# Patient Record
Sex: Female | Born: 1975 | Hispanic: No | Marital: Married | Smoking: Never smoker
Health system: Southern US, Community
[De-identification: ages and names within clinical notes are randomized; demographics above are authoritative.]

## PROBLEM LIST (undated history)

## (undated) HISTORY — PX: MULTIPLE TOOTH EXTRACTIONS: SHX2053

---

## 2006-02-04 ENCOUNTER — Inpatient Hospital Stay (HOSPITAL_COMMUNITY): Admission: AD | Admit: 2006-02-04 | Discharge: 2006-02-04 | Payer: Self-pay | Admitting: Obstetrics & Gynecology

## 2006-04-15 ENCOUNTER — Inpatient Hospital Stay (HOSPITAL_COMMUNITY): Admission: RE | Admit: 2006-04-15 | Discharge: 2006-04-18 | Payer: Self-pay | Admitting: Obstetrics & Gynecology

## 2007-02-03 ENCOUNTER — Inpatient Hospital Stay (HOSPITAL_COMMUNITY): Admission: RE | Admit: 2007-02-03 | Discharge: 2007-02-03 | Payer: Self-pay | Admitting: Obstetrics

## 2007-04-11 ENCOUNTER — Inpatient Hospital Stay (HOSPITAL_COMMUNITY): Admission: AD | Admit: 2007-04-11 | Discharge: 2007-04-14 | Payer: Self-pay | Admitting: Obstetrics

## 2008-05-25 ENCOUNTER — Inpatient Hospital Stay (HOSPITAL_COMMUNITY): Admission: AD | Admit: 2008-05-25 | Discharge: 2008-05-25 | Payer: Self-pay | Admitting: Obstetrics & Gynecology

## 2008-08-03 ENCOUNTER — Inpatient Hospital Stay (HOSPITAL_COMMUNITY): Admission: AD | Admit: 2008-08-03 | Discharge: 2008-08-05 | Payer: Self-pay | Admitting: Obstetrics & Gynecology

## 2010-04-26 LAB — CBC
HCT: 38.4 % (ref 36.0–46.0)
Hemoglobin: 13.2 g/dL (ref 12.0–15.0)
Hemoglobin: 13.7 g/dL (ref 12.0–15.0)
MCHC: 35.5 g/dL (ref 30.0–36.0)
MCV: 98 fL (ref 78.0–100.0)
RBC: 3.92 MIL/uL (ref 3.87–5.11)
RBC: 4.02 MIL/uL (ref 3.87–5.11)
WBC: 13.6 10*3/uL — ABNORMAL HIGH (ref 4.0–10.5)
WBC: 8.7 10*3/uL (ref 4.0–10.5)

## 2010-04-26 LAB — RH IMMUNE GLOB WKUP(>/=20WKS)(NOT WOMEN'S HOSP)

## 2010-04-28 LAB — RH IMMUNE GLOBULIN WORKUP (NOT WOMEN'S HOSP)
ABO/RH(D): O NEG
Antibody Screen: NEGATIVE

## 2010-06-02 NOTE — H&P (Signed)
Loretta Adams, Loretta Adams NO.:  [id number]   MEDICAL RECORD NO.:  [id number]          PATIENT TYPE:  INP   LOCATION:  9126                          FACILITY:  [facility]   PHYSICIAN:  Roseanna Rainbow, M.D.DATE OF BIRTH:  11-06-1975   DATE OF ADMISSION:  08/03/2008  DATE OF DISCHARGE:                              HISTORY & PHYSICAL   CHIEF COMPLAINT:  The patient is a 35 year old para 2 with an estimated  date of confinement of  July 17, with an intrauterine pregnancy at 40  weeks for an elective induction labor.   HISTORY OF PRESENT ILLNESS:  Please see the above.   ALLERGIES:  No known drug allergies.   MEDICATIONS:  None.   OBSTETRICAL RISK FACTORS:  History of LGA infant, Rh negative  nonsensitized.  She received RhoGAM at 28 weeks.   PRENATAL LABS:  Blood type O negative.  Antibody screen negative.  Chlamydia probe negative.  Urine culture and sensitivity no growth.  Pap  smear negative.  GC probe negative.  One hour GCT 133.  Hepatitis B  surface antigen negative.  Hematocrit 38.0, hemoglobin 12.5, HIV  nonreactive, platelet count 227,000.  RPR nonreactive.  Rubella immune.  Sickle cell negative.  GBS negative on June 21.   PAST OBSTETRICAL HISTORY:  In March 2008, she was delivered of a live  born female, full term vacuum assisted delivery, no complications and in  March 2009, she was delivered of a live born female, full term, birth  weight 8 pounds, vaginal delivery, no complications.   PAST GYNECOLOGICAL HISTORY:  Noncontributory.   PAST MEDICAL HISTORY:  No significant history of medical diseases.   PAST SURGICAL HISTORY:  No previous surgery.   SOCIAL HISTORY:  She is unemployed.  Married.  Living with her spouse.  Does not give any significant history of alcohol usage and she has no  significant smoking history.  Denies illicit drug use.   FAMILY HISTORY:  Adult-onset diabetes.   REVIEW OF SYSTEMS:  GENITOURINARY:  Mild contractions.   PHYSICAL EXAMINATION:  VITAL SIGNS:  Stable, afebrile.  Fetal heart  tracing reassuring.  Tocodynamometer irregular uterine contractions.  LUNGS:  Per the RN.  HEART:  Per the RN.  GENITOURINARY:  Cervical exam per the RN.  The cervix is 1-2 cm dilated  and 60% effaced.   ASSESSMENT:  Multipara at term for an elective induction of labor.  Fetal heart tracing consistent with fetal well being.   PLAN:  Admission, low dose Pitocin per protocol.      Roseanna Rainbow, M.D.  Electronically Signed     LAJ/MEDQ  D:  08/03/2008  T:  08/03/2008  Job:  [id number]

## 2010-06-05 NOTE — Op Note (Signed)
Loretta Adams, MCCUBBINS NO.:  [id number]   MEDICAL RECORD NO.:  [id number]          PATIENT TYPE:  INP   LOCATION:  9164                          [facility]ILITY:  [facility]   PHYSICIAN:  Roseanna Rainbow, M.D.DATE OF BIRTH:  11-Jun-1975   DATE OF PROCEDURE:  04/16/2006  DATE OF DISCHARGE:                               OPERATIVE REPORT   DELIVERY NOTE:  The patient was fully dilated and pushing, direct OA.  The station was +3 to +4. There was maternal exhaustion secondary to a  prolonged second stage. The decision was made to proceed with a vacuum-  assisted delivered. The perineum was infiltrated with lidocaine and a  midline episiotomy was cut. The mushroom vacuum was then applied to the  vertex. After the first attempt, there were two pop offs. At this point,  the [facility] was applied, and there was crowning and delivery of the head.  The shoulders were then delivered without difficulty. The cord was  clamped and cut. The infant was taken over to the warmer. The placenta  was then removed with cord traction. There were bilateral sulcal tears  as well as extension of the episiotomy into the external anal sphincter  capsule. These lacerations were repaired in the usual fashion with 2-0  and 3-0 Vicryl Rapide. The estimated blood loss was less than 500 mL.  The mother and female infant were in stable condition.      Roseanna Rainbow, M.D.  Electronically Signed     LAJ/MEDQ  D:  04/16/2006  T:  04/16/2006  Job:  [id number]

## 2010-10-08 LAB — RH IMMUNE GLOBULIN WORKUP (NOT WOMEN'S HOSP): Antibody Screen: NEGATIVE

## 2010-10-12 LAB — RH IMMUNE GLOB WKUP(>/=20WKS)(NOT WOMEN'S HOSP): Fetal Screen: NEGATIVE

## 2010-10-12 LAB — CBC
Hemoglobin: 12
MCV: 92.6
Platelets: 211
RBC: 3.77 — ABNORMAL LOW
RDW: 12.6
RDW: 12.9
WBC: 15.7 — ABNORMAL HIGH

## 2010-10-12 LAB — RPR: RPR Ser Ql: NONREACTIVE

## 2011-07-23 ENCOUNTER — Ambulatory Visit: Payer: PRIVATE HEALTH INSURANCE

## 2011-07-23 ENCOUNTER — Ambulatory Visit (INDEPENDENT_AMBULATORY_CARE_PROVIDER_SITE_OTHER): Payer: PRIVATE HEALTH INSURANCE | Admitting: Family Medicine

## 2011-07-23 VITALS — BP 100/62 | HR 59 | Temp 97.8°F | Resp 22 | Ht 67.0 in | Wt 189.0 lb

## 2011-07-23 DIAGNOSIS — R0602 Shortness of breath: Secondary | ICD-10-CM

## 2011-07-23 DIAGNOSIS — Z77098 Contact with and (suspected) exposure to other hazardous, chiefly nonmedicinal, chemicals: Secondary | ICD-10-CM

## 2011-07-23 DIAGNOSIS — Z7729 Contact with and (suspected ) exposure to other hazardous substances: Secondary | ICD-10-CM

## 2011-07-23 DIAGNOSIS — J309 Allergic rhinitis, unspecified: Secondary | ICD-10-CM

## 2011-07-23 MED ORDER — METHYLPREDNISOLONE ACETATE 80 MG/ML IJ SUSP: 80.0000 mg | Freq: Once | INTRAMUSCULAR | Status: DC

## 2011-07-23 MED ORDER — METHYLPREDNISOLONE SODIUM SUCC 125 MG IJ SOLR
125.0000 mg | Freq: Once | INTRAMUSCULAR | Status: AC
  Administered 2011-07-23: 125 mg via INTRAMUSCULAR

## 2011-07-23 MED ORDER — METHYLPREDNISOLONE SODIUM SUCC 125 MG IJ SOLR: 125.0000 mg | Freq: Once | INTRAMUSCULAR | Status: DC

## 2011-07-23 MED ORDER — ALBUTEROL SULFATE (2.5 MG/3ML) 0.083% IN NEBU
2.5000 mg | INHALATION_SOLUTION | Freq: Once | RESPIRATORY_TRACT | Status: AC
  Administered 2011-07-23: 2.5 mg via RESPIRATORY_TRACT

## 2011-07-23 MED ORDER — ALBUTEROL SULFATE HFA 108 (90 BASE) MCG/ACT IN AERS: 2.0000 | INHALATION_SPRAY | Freq: Four times a day (QID) | RESPIRATORY_TRACT | Status: DC | PRN

## 2011-07-23 NOTE — Progress Notes (Signed)
  [facility]:  Office Visit  Chief Complaint:  Chief Complaint  Patient presents with  . Shortness of Breath    inhaled cleaner 4 days ago--feels tight    HPI: Loretta Adams is a 36 y.o. female who complains of: Allergic reaction to bleach 4 days ago and tire cleaner. Was not wearing mask. She has no h/o ashtma or allergies. She denies any URI sxs. This has happened before. Nonsmoker.  Has had prior problems with smoke fumes as well but not this bad. Has not tried anything for this. She is on progesterone but no other risk factors for DVT/PE.   History reviewed. No pertinent past medical history. History reviewed. No pertinent past surgical history. History   Social History  . Marital Status: Married    Spouse Name: N/A    Number of Children: N/A  . Years of Education: N/A   Social History Main Topics  . Smoking status: Never Smoker   . Smokeless tobacco: None  . Alcohol Use: No  . Drug Use: No  . Sexually Active: None   Other Topics Concern  . None   Social History Narrative  . None   Family History  Problem Relation Age of Onset  . Asthma Maternal Grandmother    Allergies  Allergen Reactions  . Amoxicillin Palpitations   Prior to Admission medications   Medication Sig Start Date End Date Taking? Authorizing Provider  levonorgestrel (MIRENA) 20 MCG/24HR IUD 1 each by Intrauterine route once.   Yes Historical Provider, MD     ROS: The patient denies fevers, chills, night sweats, unintentional weight loss, chest pain, palpitations, wheezing, dyspnea on exertion, nausea, vomiting, abdominal pain, dysuria, hematuria, melena, numbness, weakness, or tingling. + SOB  All other systems have been reviewed and were otherwise negative with the exception of those mentioned in the HPI and as above.    PHYSICAL EXAM: Filed Vitals:   07/23/11 1729  BP: 100/62  Pulse: 59  Temp: 97.8 F (36.6 C)  Resp: 22   Filed Vitals:   07/23/11 1729  Height:  5\' 7"  (1.702 m)  Weight: 189 lb (85.73 kg)   Body mass index is 29.60 kg/(m^2).  General: Alert, no acute distress HEENT:  Normocephalic, atraumatic, oropharynx patent.  Cardiovascular:  Regular rate and rhythm, no rubs murmurs or gallops.  No Carotid bruits, radial pulse intact. No pedal edema.  Respiratory: Clear to auscultation bilaterally.  No wheezes, rales, or rhonchi.  No cyanosis, no use of accessory musculature GI: No organomegaly, abdomen is soft and non-tender, positive bowel sounds.  No masses. Skin: No rashes. Neurologic: Facial musculature symmetric. Psychiatric: Patient is appropriate throughout our interaction. Lymphatic: No cervical lymphadenopathy Musculoskeletal: Gait intact.   LABS:  EKG/XRAY:   Primary read interpreted by Dr. Conley Rolls at [facility]. No infiltrate/effusion.   ASSESSMENT/PLAN: Encounter Diagnoses  Name Primary?  . SOB (shortness of breath) Yes  . Exposure to chemical irritant   . Allergic reaction to inhalant     Rx: IM Solumedrol x 1 and also albuterol inhaler prn for home. If worsening sxs consider medrol/PO steroid.    Parnika Tweten PHUONG, DO 07/24/2011 9:15 AM

## 2012-04-26 ENCOUNTER — Ambulatory Visit: Payer: PRIVATE HEALTH INSURANCE | Admitting: Obstetrics & Gynecology

## 2013-01-01 ENCOUNTER — Ambulatory Visit: Payer: PRIVATE HEALTH INSURANCE | Admitting: Obstetrics & Gynecology

## 2013-01-01 ENCOUNTER — Encounter: Payer: Self-pay | Admitting: Obstetrics & Gynecology

## 2013-01-01 ENCOUNTER — Ambulatory Visit (INDEPENDENT_AMBULATORY_CARE_PROVIDER_SITE_OTHER): Payer: BC Managed Care – PPO | Admitting: Obstetrics & Gynecology

## 2013-01-01 VITALS — BP 122/78 | HR 61 | Temp 98.1°F | Ht 67.0 in | Wt 199.0 lb

## 2013-01-01 DIAGNOSIS — Z0000 Encounter for general adult medical examination without abnormal findings: Secondary | ICD-10-CM

## 2013-01-01 DIAGNOSIS — Z3202 Encounter for pregnancy test, result negative: Secondary | ICD-10-CM

## 2013-01-01 DIAGNOSIS — Z01419 Encounter for gynecological examination (general) (routine) without abnormal findings: Secondary | ICD-10-CM

## 2013-01-01 LAB — POCT URINALYSIS DIPSTICK
Bilirubin, UA: NEGATIVE
Glucose, UA: NEGATIVE
Nitrite, UA: NEGATIVE
Spec Grav, UA: >=1.03
pH, UA: 5

## 2013-01-01 NOTE — Progress Notes (Signed)
Subjective:     Loretta Adams is a 37 y.o. female here for a routine exam.  Current complaints: irregular vaginal bleeding, states she would like schedule to have IUD taken out, has occasional when talking would like to know if it could be because of hormones she is taking  Personal health questionnaire reviewed: yes.   Gynecologic History No LMP recorded. Patient is not currently having periods (Reason: IUD). Contraception: IUD Last ZOX:[id number]. Results were: denies any abnormal results Last mammogram: N/A  Obstetric History OB History  No data available     The following portions of the patient's history were reviewed and updated as appropriate: allergies, current medications, past family history, past medical history, past social history, past surgical history and problem list.  Review of Systems Pertinent items are noted in HPI.    Objective:    General appearance: alert Breasts: normal appearance, no masses or tenderness Abdomen: soft, non-tender; bowel sounds normal; no masses,  no organomegaly Pelvic: cervix normal in appearance, IUD strings palpated, external genitalia normal, no adnexal masses or tenderness, uterus normal size, shape, and consistency and vagina normal without discharge    Assessment:    Healthy female exam.    Plan:   Return prn or 9/15

## 2013-01-02 ENCOUNTER — Encounter: Payer: Self-pay | Admitting: Obstetrics & Gynecology

## 2013-01-02 NOTE — Patient Instructions (Signed)

## 2013-01-03 LAB — PAP IG AND HPV HIGH-RISK: HPV DNA High Risk: NOT DETECTED

## 2013-02-28 ENCOUNTER — Ambulatory Visit: Payer: BC Managed Care – PPO | Admitting: Obstetrics & Gynecology

## 2013-09-26 ENCOUNTER — Ambulatory Visit: Payer: BC Managed Care – PPO | Admitting: Obstetrics & Gynecology

## 2013-09-27 ENCOUNTER — Encounter: Payer: Self-pay | Admitting: Obstetrics & Gynecology

## 2013-09-27 ENCOUNTER — Ambulatory Visit (INDEPENDENT_AMBULATORY_CARE_PROVIDER_SITE_OTHER): Payer: BC Managed Care – PPO | Admitting: Obstetrics & Gynecology

## 2013-09-27 VITALS — BP 137/81 | HR 67 | Temp 97.8°F | Wt 211.0 lb

## 2013-09-27 DIAGNOSIS — Z30432 Encounter for removal of intrauterine contraceptive device: Secondary | ICD-10-CM

## 2013-09-27 DIAGNOSIS — Z01818 Encounter for other preprocedural examination: Secondary | ICD-10-CM

## 2013-09-27 DIAGNOSIS — Z3202 Encounter for pregnancy test, result negative: Secondary | ICD-10-CM

## 2013-09-27 LAB — POCT URINE PREGNANCY: Preg Test, Ur: NEGATIVE

## 2013-09-30 NOTE — Progress Notes (Signed)
Patient ID: Loretta Adams, female   DOB: 11-08-1975, 38 y.o.   MRN: [id number]  Chief Complaint  Patient presents with  . Contraception    iud removal    HPI Loretta Adams is a 38 y.o. female.  See above.  Considering becoming pregnant.  HPI  No past medical history on file.  Past Surgical History  Procedure Laterality Date  . Multiple tooth extractions      Family History  Problem Relation Age of Onset  . Asthma Maternal Grandmother     Social History History  Substance Use Topics  . Smoking status: Never Smoker   . Smokeless tobacco: Not on file  . Alcohol Use: No    Allergies  Allergen Reactions  . Amoxicillin Palpitations    Current Outpatient Prescriptions  Medication Sig Dispense Refill  . albuterol (PROVENTIL HFA;VENTOLIN HFA) 108 (90 BASE) MCG/ACT inhaler Inhale 2 puffs into the lungs every 6 (six) hours as needed for wheezing or shortness of breath.  1 Inhaler  0  . levonorgestrel (MIRENA) 20 MCG/24HR IUD 1 each by Intrauterine route once.       No current facility-administered medications for this visit.    Review of Systems Review of Systems Constitutional: negative for fatigue and weight loss Respiratory: negative for cough and wheezing Cardiovascular: negative for chest pain, fatigue and palpitations Gastrointestinal: negative for abdominal pain and change in bowel habits Genitourinary:negative for abnormal vaginal discharge Integument/breast: negative for nipple discharge Musculoskeletal:negative for myalgias Neurological: negative for gait problems and tremors Behavioral/Psych: negative for abusive relationship, depression Endocrine: negative for temperature intolerance     Blood pressure 137/81, pulse 67, temperature 97.8 F (36.6 C), weight 95.709 kg (211 lb).  Physical Exam Physical Exam General:   alert  Skin:   no rash or abnormalities  Lungs:   clear to auscultation bilaterally  Heart:   regular rate and rhythm, S1, S2 normal, no  murmur, click, rub or gallop  Abdomen:  normal findings: no organomegaly, soft, non-tender and no hernia  Pelvis:  External genitalia: normal general appearance Urinary system: urethral meatus normal and bladder without fullness, nontender Vaginal: normal without tenderness, induration or masses Cervix: normal appearance; IUD strings seen; the IUD was removed intact Adnexa: normal bimanual exam Uterus: anteverted and non-tender, normal size      Data Reviewed None  Assessment    S/P IUD removal     Plan   Implications of AMA and pregnancy reviewed Preconception pt education materials provided Orders Placed This Encounter  Procedures  . POCT urine pregnancy    Follow up as needed.         JACKSON-MOORE,Lamark Schue A 09/30/2013, 11:21 AM

## 2013-09-30 NOTE — Patient Instructions (Signed)
Preparing for Pregnancy Before trying to become pregnant, make an appointment with your health care provider (preconception care). The goal is to help you have a healthy, safe pregnancy. At your first appointment, your health care provider will:   Do a complete physical exam, including a Pap test.  Take a complete medical history.  Give you advice and help you resolve any problems. PRECONCEPTION CHECKLIST Here is a list of the basics to cover with your health care provider at your preconception visit:  Medical history.  Tell your health care provider about any diseases you have had. Many diseases can affect your pregnancy.  Include your partner's medical history and family history.  Make sure you have been tested for sexually transmitted infections (STIs). These can affect your pregnancy. In some cases, they can be passed to your baby. Tell your health care provider about any history of STIs.  Make sure your health care provider knows about any previous problems you have had with conception or pregnancy.  Tell your health care provider about any medicine you take. This includes herbal supplements and over-the-counter medicines.  Make sure all your immunizations are up to date. You may need to make additional appointments.  Ask your health care provider if you need any vaccinations or if there are any you should avoid.  Diet.  It is especially important to eat a healthy, balanced diet with the right nutrients when you are pregnant.  Ask your health care provider to help you get to a healthy weight before pregnancy.  If you are overweight, you are at higher risk for certain complications. These include high blood pressure, diabetes, and preterm birth.  If you are underweight, you are more likely to have a low-birth-weight baby.  Lifestyle.  Tell your health care provider about lifestyle factors such as alcohol use, drug use, or smoking.  Describe any harmful substances you may  be exposed to at work or home. These can include chemicals, pesticides, and radiation.  Mental health.  Let your health care provider know if you have been feeling depressed or anxious.  Let your health care provider know if you have a history of substance abuse.  Let your health care provider know if you do not feel safe at home. HOME INSTRUCTIONS TO PREPARE FOR PREGNANCY Follow your health care provider's advice and instructions.   Keep an accurate record of your menstrual periods. This makes it easier for your health care provider to determine your baby's due date.  Begin taking prenatal vitamins and folic acid supplements daily. Take them as directed by your health care provider.  Eat a balanced diet. Get help from a nutrition counselor if you have questions or need help.  Get regular exercise. Try to be active for at least 30 minutes a day most days of the week.  Quit smoking, if you smoke.  Do not drink alcohol.  Do not take illegal drugs.  Get medical problems, such as diabetes or high blood pressure, under control.  If you have diabetes, make sure you do the following:  Have good blood sugar control. If you have type 1 diabetes, use multiple daily doses of insulin. Do not use split-dose or premixed insulin.  Have an eye exam by a qualified eye care professional trained in caring for people with diabetes.  Get evaluated by your health care provider for cardiovascular disease.  Get to a healthy weight. If you are overweight or obese, reduce your weight with the help of a qualified health   professional such as a registered dietitian. Ask your health care provider what the right weight range is for you. HOW DO I KNOW I AM PREGNANT? You may be pregnant if you have been sexually active and you miss your period. Symptoms of early pregnancy include:   Mild cramping.  Very light vaginal bleeding (spotting).  Feeling unusually tired.  Morning sickness. If you have any of  these symptoms, take a home pregnancy test. These tests look for a hormone called human chorionic gonadotropin (hCG) in your urine. Your body begins to make this hormone during early pregnancy. These tests are very accurate. Wait until at least the first day you miss your period to take one. If you get a positive result, call your health care provider to make appointments for prenatal care. WHAT SHOULD I DO IF I BECOME PREGNANT?  Make an appointment with your health care provider by week 12 of your pregnancy at the latest.  Do not smoke. Smoking can be harmful to your baby.  Do not drink alcoholic beverages. Alcohol is related to a number of birth defects.  Avoid toxic odors and chemicals.  You may continue to have sexual intercourse if it does not cause pain or other problems, such as vaginal bleeding. Document Released: 12/18/2007 Document Revised: 05/21/2013 Document Reviewed: 12/11/2012 ExitCare Patient Information 2015 [employer]. This information is not intended to replace advice given to you by your health care provider. Make sure you discuss any questions you have with your health care provider.  

## 2014-01-14 ENCOUNTER — Encounter: Payer: Self-pay | Admitting: *Deleted

## 2014-01-15 ENCOUNTER — Encounter: Payer: Self-pay | Admitting: Obstetrics & Gynecology

## 2014-01-18 NOTE — L&D Delivery Note (Addendum)
Delivery Note At 6:06 PM a viable female, "Yousef", was delivered via  (Presentation: Left Occiput Anterior).  APGAR: , ; weight  .   Placenta status: Intact, Spontaneous.  Cord: 3 vessels with the following complications: None.  Cord pH: NA  Anesthesia: Epidural  Episiotomy: None Lacerations: 2nd degree;Perineal Suture Repair: 3.0 vicryl Est. Blood Loss (mL):  100 cc  Mom to postpartum.  Baby to Couplet care / Skin to Skin. Family plans outpatient circumcision. Patient is undecided regarding contraception.  Nigel BridgemanLATHAM, Nixxon Faria 07/19/2014, 6:33 PM

## 2014-03-14 LAB — OB RESULTS CONSOLE RPR: RPR: NONREACTIVE

## 2014-03-14 LAB — OB RESULTS CONSOLE ABO/RH: RH TYPE: NEGATIVE

## 2014-03-14 LAB — OB RESULTS CONSOLE RUBELLA ANTIBODY, IGM: RUBELLA: IMMUNE

## 2014-03-14 LAB — OB RESULTS CONSOLE ANTIBODY SCREEN: Antibody Screen: NEGATIVE

## 2014-03-14 LAB — OB RESULTS CONSOLE HEPATITIS B SURFACE ANTIGEN: Hepatitis B Surface Ag: NEGATIVE

## 2014-03-14 LAB — OB RESULTS CONSOLE HIV ANTIBODY (ROUTINE TESTING): HIV: NONREACTIVE

## 2014-03-14 LAB — OB RESULTS CONSOLE GC/CHLAMYDIA
Chlamydia: NEGATIVE
Gonorrhea: NEGATIVE

## 2014-07-15 ENCOUNTER — Encounter (HOSPITAL_COMMUNITY): Payer: Self-pay | Admitting: *Deleted

## 2014-07-15 ENCOUNTER — Telehealth (HOSPITAL_COMMUNITY): Payer: Self-pay | Admitting: *Deleted

## 2014-07-15 ENCOUNTER — Inpatient Hospital Stay (HOSPITAL_COMMUNITY): Admission: RE | Admit: 2014-07-15 | Payer: Self-pay | Source: Ambulatory Visit

## 2014-07-15 LAB — OB RESULTS CONSOLE GBS: GBS: NEGATIVE

## 2014-07-15 NOTE — Telephone Encounter (Signed)
Preadmission screen  

## 2014-07-16 ENCOUNTER — Encounter (HOSPITAL_COMMUNITY): Payer: Self-pay | Admitting: *Deleted

## 2014-07-16 ENCOUNTER — Telehealth (HOSPITAL_COMMUNITY): Payer: Self-pay | Admitting: *Deleted

## 2014-07-16 NOTE — Telephone Encounter (Signed)
Preadmission screen  

## 2014-07-19 ENCOUNTER — Inpatient Hospital Stay (HOSPITAL_COMMUNITY)
Admission: RE | Admit: 2014-07-19 | Discharge: 2014-07-20 | DRG: 775 | Disposition: A | Discharge status: Home / Self Care | Payer: BLUE CROSS/BLUE SHIELD | Source: Ambulatory Visit | Attending: Obstetrics & Gynecology | Admitting: Obstetrics & Gynecology

## 2014-07-19 ENCOUNTER — Inpatient Hospital Stay (HOSPITAL_COMMUNITY): Payer: BLUE CROSS/BLUE SHIELD | Admitting: Anesthesiology

## 2014-07-19 ENCOUNTER — Encounter (HOSPITAL_COMMUNITY): Payer: Self-pay

## 2014-07-19 DIAGNOSIS — O09299 Supervision of pregnancy with other poor reproductive or obstetric history, unspecified trimester: Secondary | ICD-10-CM

## 2014-07-19 DIAGNOSIS — O3663X0 Maternal care for excessive fetal growth, third trimester, not applicable or unspecified: Principal | ICD-10-CM | POA: Diagnosis present

## 2014-07-19 DIAGNOSIS — O09523 Supervision of elderly multigravida, third trimester: Secondary | ICD-10-CM | POA: Diagnosis present

## 2014-07-19 DIAGNOSIS — Z3A39 39 weeks gestation of pregnancy: Secondary | ICD-10-CM | POA: Diagnosis present

## 2014-07-19 DIAGNOSIS — O0930 Supervision of pregnancy with insufficient antenatal care, unspecified trimester: Secondary | ICD-10-CM

## 2014-07-19 DIAGNOSIS — Z880 Allergy status to penicillin: Secondary | ICD-10-CM

## 2014-07-19 DIAGNOSIS — IMO0002 Reserved for concepts with insufficient information to code with codable children: Secondary | ICD-10-CM | POA: Diagnosis present

## 2014-07-19 DIAGNOSIS — O280 Abnormal hematological finding on antenatal screening of mother: Secondary | ICD-10-CM | POA: Diagnosis present

## 2014-07-19 DIAGNOSIS — O26899 Other specified pregnancy related conditions, unspecified trimester: Secondary | ICD-10-CM | POA: Diagnosis present

## 2014-07-19 DIAGNOSIS — Z6791 Unspecified blood type, Rh negative: Secondary | ICD-10-CM | POA: Diagnosis present

## 2014-07-19 DIAGNOSIS — O09529 Supervision of elderly multigravida, unspecified trimester: Secondary | ICD-10-CM

## 2014-07-19 LAB — CBC
HCT: 34.9 % — ABNORMAL LOW (ref 36.0–46.0)
Hemoglobin: 11.5 g/dL — ABNORMAL LOW (ref 12.0–15.0)
MCH: 30 pg (ref 26.0–34.0)
MCHC: 33 g/dL (ref 30.0–36.0)
MCV: 91.1 fL (ref 78.0–100.0)
Platelets: 171 10*3/uL (ref 150–400)
RBC: 3.83 MIL/uL — AB (ref 3.87–5.11)
RDW: 14.2 % (ref 11.5–15.5)
WBC: 9.3 10*3/uL (ref 4.0–10.5)

## 2014-07-19 LAB — TYPE AND SCREEN
ABO/RH(D): O NEG
ANTIBODY SCREEN: NEGATIVE

## 2014-07-19 MED ORDER — ZOLPIDEM TARTRATE 5 MG PO TABS: 5.0000 mg | ORAL_TABLET | Freq: Every evening | ORAL | Status: DC | PRN

## 2014-07-19 MED ORDER — OXYTOCIN 40 UNITS IN LACTATED RINGERS INFUSION - SIMPLE MED: 62.5000 mL/h | INTRAVENOUS | Status: DC

## 2014-07-19 MED ORDER — OXYCODONE-ACETAMINOPHEN 5-325 MG PO TABS: 2.0000 | ORAL_TABLET | ORAL | Status: DC | PRN

## 2014-07-19 MED ORDER — IBUPROFEN 600 MG PO TABS
600.0000 mg | ORAL_TABLET | Freq: Four times a day (QID) | ORAL | Status: DC
  Administered 2014-07-20 (×3): 600 mg via ORAL
  Filled 2014-07-19 (×4): qty 1

## 2014-07-19 MED ORDER — LIDOCAINE HCL (PF) 1 % IJ SOLN
INTRAMUSCULAR | Status: DC | PRN
  Administered 2014-07-19 (×2): 4 mL

## 2014-07-19 MED ORDER — LIDOCAINE HCL (PF) 1 % IJ SOLN
30.0000 mL | INTRAMUSCULAR | Status: DC | PRN
  Administered 2014-07-19: 30 mL via SUBCUTANEOUS
  Filled 2014-07-19: qty 30

## 2014-07-19 MED ORDER — PHENYLEPHRINE 40 MCG/ML (10ML) SYRINGE FOR IV PUSH (FOR BLOOD PRESSURE SUPPORT)
80.0000 ug | PREFILLED_SYRINGE | INTRAVENOUS | Status: DC | PRN
  Administered 2014-07-19: 1 ug via INTRAVENOUS
  Filled 2014-07-19: qty 2
  Filled 2014-07-19: qty 20

## 2014-07-19 MED ORDER — DIPHENHYDRAMINE HCL 50 MG/ML IJ SOLN: 12.5000 mg | INTRAMUSCULAR | Status: DC | PRN

## 2014-07-19 MED ORDER — TERBUTALINE SULFATE 1 MG/ML IJ SOLN
0.2500 mg | Freq: Once | INTRAMUSCULAR | Status: DC | PRN
  Filled 2014-07-19: qty 1

## 2014-07-19 MED ORDER — DIPHENHYDRAMINE HCL 25 MG PO CAPS: 25.0000 mg | ORAL_CAPSULE | Freq: Four times a day (QID) | ORAL | Status: DC | PRN

## 2014-07-19 MED ORDER — OXYCODONE-ACETAMINOPHEN 5-325 MG PO TABS: 1.0000 | ORAL_TABLET | ORAL | Status: DC | PRN

## 2014-07-19 MED ORDER — FLEET ENEMA 7-19 GM/118ML RE ENEM: 1.0000 | ENEMA | RECTAL | Status: DC | PRN

## 2014-07-19 MED ORDER — ONDANSETRON HCL 4 MG PO TABS: 4.0000 mg | ORAL_TABLET | ORAL | Status: DC | PRN

## 2014-07-19 MED ORDER — ONDANSETRON HCL 4 MG/2ML IJ SOLN: 4.0000 mg | Freq: Four times a day (QID) | INTRAMUSCULAR | Status: DC | PRN

## 2014-07-19 MED ORDER — TETANUS-DIPHTH-ACELL PERTUSSIS 5-2.5-18.5 LF-MCG/0.5 IM SUSP: 0.5000 mL | Freq: Once | INTRAMUSCULAR | Status: DC

## 2014-07-19 MED ORDER — SIMETHICONE 80 MG PO CHEW: 80.0000 mg | CHEWABLE_TABLET | ORAL | Status: DC | PRN

## 2014-07-19 MED ORDER — PRENATAL MULTIVITAMIN CH
1.0000 | ORAL_TABLET | Freq: Every day | ORAL | Status: DC
  Administered 2014-07-20: 1 via ORAL
  Filled 2014-07-19: qty 1

## 2014-07-19 MED ORDER — WITCH HAZEL-GLYCERIN EX PADS: 1.0000 "application " | MEDICATED_PAD | CUTANEOUS | Status: DC | PRN

## 2014-07-19 MED ORDER — OXYTOCIN BOLUS FROM INFUSION: 500.0000 mL | INTRAVENOUS | Status: DC

## 2014-07-19 MED ORDER — LACTATED RINGERS IV SOLN: 500.0000 mL | INTRAVENOUS | Status: DC | PRN

## 2014-07-19 MED ORDER — BENZOCAINE-MENTHOL 20-0.5 % EX AERO
1.0000 "application " | INHALATION_SPRAY | CUTANEOUS | Status: DC | PRN
  Administered 2014-07-20: 1 via TOPICAL
  Filled 2014-07-19 (×2): qty 56

## 2014-07-19 MED ORDER — ACETAMINOPHEN 325 MG PO TABS: 650.0000 mg | ORAL_TABLET | ORAL | Status: DC | PRN

## 2014-07-19 MED ORDER — FENTANYL 2.5 MCG/ML BUPIVACAINE 1/10 % EPIDURAL INFUSION (WH - ANES)
14.0000 mL/h | INTRAMUSCULAR | Status: DC | PRN
  Administered 2014-07-19 (×2): 14 mL/h via EPIDURAL
  Filled 2014-07-19: qty 125

## 2014-07-19 MED ORDER — EPHEDRINE 5 MG/ML INJ
10.0000 mg | INTRAVENOUS | Status: DC | PRN
  Filled 2014-07-19: qty 2

## 2014-07-19 MED ORDER — FENTANYL CITRATE (PF) 100 MCG/2ML IJ SOLN: 100.0000 ug | INTRAMUSCULAR | Status: DC | PRN

## 2014-07-19 MED ORDER — LANOLIN HYDROUS EX OINT: TOPICAL_OINTMENT | CUTANEOUS | Status: DC | PRN

## 2014-07-19 MED ORDER — OXYTOCIN 40 UNITS IN LACTATED RINGERS INFUSION - SIMPLE MED
1.0000 m[IU]/min | INTRAVENOUS | Status: DC
  Administered 2014-07-19: 2 m[IU]/min via INTRAVENOUS
  Administered 2014-07-19: 4 m[IU]/min via INTRAVENOUS
  Administered 2014-07-19: 18 m[IU]/min via INTRAVENOUS
  Filled 2014-07-19: qty 1000

## 2014-07-19 MED ORDER — DIBUCAINE 1 % RE OINT: 1.0000 "application " | TOPICAL_OINTMENT | RECTAL | Status: DC | PRN

## 2014-07-19 MED ORDER — SENNOSIDES-DOCUSATE SODIUM 8.6-50 MG PO TABS
2.0000 | ORAL_TABLET | ORAL | Status: DC
  Administered 2014-07-20: 2 via ORAL
  Filled 2014-07-19: qty 2

## 2014-07-19 MED ORDER — CITRIC ACID-SODIUM CITRATE 334-500 MG/5ML PO SOLN: 30.0000 mL | ORAL | Status: DC | PRN

## 2014-07-19 MED ORDER — LACTATED RINGERS IV SOLN
INTRAVENOUS | Status: DC
  Administered 2014-07-19: 500 mL via INTRAVENOUS

## 2014-07-19 MED ORDER — ONDANSETRON HCL 4 MG/2ML IJ SOLN
4.0000 mg | INTRAMUSCULAR | Status: DC | PRN
Start: 2014-07-19 — End: 2014-07-20

## 2014-07-19 NOTE — Progress Notes (Signed)
  Subjective: Comfortable with epidural.  Objective: BP 113/70 mmHg  Pulse 68  Temp(Src) 97.8 F (36.6 C) (Oral)  Resp 18  Ht 5\' 5"  (1.651 m)  Wt 103.42 kg (228 lb)  BMI 37.94 kg/m2  SpO2 100%  LMP 10/15/2013      FHT: Category 1 UC:   regular, every 3 minutes SVE:   Dilation: 4 Effacement (%): 80 Station: -3 Exam by:: V Darryll Raju  Vtx not well applied at present. Pitocin at 16 mu/min  Assessment:  Induction for favorable cervix and hx LGA infant  Plan: Continue current plan. Recheck in 1 hour for possible AROM.  Nigel BridgemanLATHAM, Jenna Routzahn CNM 07/19/2014, 3:08 PM

## 2014-07-19 NOTE — H&P (Signed)
Loretta Adams is a 39 y.o. female, G4P3003 at 3939 4/7 weeks, presenting for induction due to hx of LGA fetus and favorable cervix.  Denies leaking or bleeding, reports +FM.  Cervix has been 3 cm in the office.  Patient Active Problem List   Diagnosis Date Noted  . LGA (large for gestational age) fetus 07/19/2014  . Marginal insertion of umbilical cord 07/19/2014  . Abnormal maternal serum screening test--elevated risk of Trisomy 18 on screening, declined further testing 07/19/2014  . AMA (advanced maternal age) multigravida 35+ 07/19/2014  . Rh negative status during pregnancy 07/19/2014  . Late prenatal care 07/19/2014  . Hx of delivery by vacuum extraction, currently pregnant--2008 delivery 07/19/2014  . Normal labor 07/19/2014  . Allergy to amoxicillin 07/19/2014    History of present pregnancy: Patient entered care at 23 4/7 weeks--previous patient of [facility], but no care prior to establishing with [facility]. EDC of 07/22/14 was established by LMP Anatomy scan:  At 26 weeks at [facility]--marginal insertion suspected.   Additional US evaluations:   30 3/7 weeks:  Marginal insertion of cord, EFW 3+14, 69%ile, AFI 10.8, low normal 32 3/7 weeks:  EFW 5+2, 88%ile, normal fluid. 36 4/7 weeks:  EFW 7+5, 85%ile, normal fluid   Significant prenatal events:  Entered care at 23 weeks.  Elevated trisomy 18 risk (1;46), but patient declined MFM referral and any additional testing.  Followed with US q 4 weeks due to marginal cord insertion and elevated trisomy 18 risk.  GBS negative.  Requested induction at 39 weeks due to hx LGA fetuses in past.  Received Rhophylac 05/16/14. Last evaluation:  07/09/14--cervix 3 cm, BP 108/60.  OB History    Gravida Para Term Preterm AB TAB SAB Ectopic Multiple Living   4 3 3       3     2008--VE assisted due to maternal exhaustion, 9+7, epidural, female, Dr. Tamela OddiJackson Moore 2009--SVB, 8+14, female, [facility] 2010--SVB, 8+14, epidural, [facility]  History reviewed. No pertinent past medical  history. Past Surgical History  Procedure Laterality Date  . Multiple tooth extractions     Family History: family history includes Asthma in her maternal grandmother; Diabetes in her maternal grandmother; Heart disease in her maternal grandmother; Hyperlipidemia in her maternal grandmother; Hypertension in her maternal grandmother.   Social History:  reports that she has never smoked. She has never used smokeless tobacco. She reports that she does not drink alcohol or use illicit drugs.  Patient is from [place], of the Muslim faith, married to FOB, college-educated, employed as an Data processing managerassistant teacher.  FOB is [employer].   Prenatal Transfer Tool  Maternal Diabetes: No Genetic Screening: Abnormal:  Results: Elevated risk of Trisomy 18 on Quad screen--declined further testing, no evidence of abnormalities on US Maternal Ultrasounds/Referrals: Normal Fetal Ultrasounds or other Referrals:  None Maternal Substance Abuse:  No Significant Maternal Medications:  None Significant Maternal Lab Results: Lab values include: Group B Strep negative, Rh negative  TDAP NA Flu NA  ROS:  FM, occasional UCs.  Allergies  Allergen Reactions  . Amoxicillin Palpitations     Dilation: 3 Effacement (%): 50 Station: -3 Exam by:: V Desire Fulp Blood pressure 126/69, pulse 76, last menstrual period 10/15/2013.  Chest clear Heart RRR without murmur Abd gravid, NT, FH 40 cm Pelvic: Cervix posterior, 3 cm, 50%, vtx, -3. Ext: WNL  FHR: Category 1 UCs:  q 5 min, mild  Prenatal labs: ABO, Rh: O/Negative/-- (02/25 0000) Antibody: Negative (02/25 0000) Rubella:   Immune RPR: Nonreactive (  02/25 0000)  HBsAg: Negative (02/25 0000)  HIV: Non-reactive (02/25 0000)  GBS: Negative (06/27 0000) Sickle cell/Hgb electrophoresis:  WNL Pap:  Unknown GC:  Negative 03/14/14 Chlamydia:  Negative 03/14/14 Genetic screenings:  Elevated Trisomy 18 risk (1:46) on Quad screen, declined further testing Glucola:  WNL Other:   Hgb 12.4 at NOB, 12.1 at 28 weeks       Assessment/Plan: IUP at 39 4/7 weeks Hx LGA fetus Favorable cervix GBS negative Elevated risk of Trisomy 18 on screening, declined further testing Marginal insertion of cord Rh negative  Plan: Admit to Birthing Suite per consult with Dr. Sallye Ober Routine [facility] orders Start pitocin per low-dose protocol Pain med/epidural prn   Loretta Adams, VICKICNM, [place] 07/19/2014, 8:14 AM

## 2014-07-19 NOTE — Anesthesia Preprocedure Evaluation (Signed)
Anesthesia Evaluation  Patient identified by MRN, date of birth, ID band Patient awake    Reviewed: Allergy & Precautions, NPO status , Patient's Chart, lab work & pertinent test results  History of Anesthesia Complications Negative for: history of anesthetic complications  Airway Mallampati: II  TM Distance: >3 FB Neck ROM: Full    Dental no notable dental hx. (+) Dental Advisory Given   Pulmonary neg pulmonary ROS,  breath sounds clear to auscultation  Pulmonary exam normal       Cardiovascular negative cardio ROS Normal cardiovascular examRhythm:Regular Rate:Normal     Neuro/Psych negative neurological ROS  negative psych ROS   GI/Hepatic negative GI ROS, Neg liver ROS,   Endo/Other  obesity  Renal/GU negative Renal ROS  negative genitourinary   Musculoskeletal negative musculoskeletal ROS (+)   Abdominal   Peds negative pediatric ROS (+)  Hematology negative hematology ROS (+)   Anesthesia Other Findings   Reproductive/Obstetrics (+) Pregnancy                             Anesthesia Physical Anesthesia Plan  ASA: II  Anesthesia Plan: Epidural   Post-op Pain Management:    Induction:   Airway Management Planned:   Additional Equipment:   Intra-op Plan:   Post-operative Plan:   Informed Consent: I have reviewed the patients History and Physical, chart, labs and discussed the procedure including the risks, benefits and alternatives for the proposed anesthesia with the patient or authorized representative who has indicated his/her understanding and acceptance.   Dental advisory given  Plan Discussed with:   Anesthesia Plan Comments:         Anesthesia Quick Evaluation

## 2014-07-19 NOTE — Progress Notes (Signed)
  Subjective: Comfortable with epidural.  Objective: BP 108/85 mmHg  Pulse 71  Temp(Src) 97.8 F (36.6 C) (Oral)  Resp 18  Ht 5\' 5"  (1.651 m)  Wt 103.42 kg (228 lb)  BMI 37.94 kg/m2  SpO2 100%  LMP 10/15/2013      FHT: Category 1 UC:   regular, every 3-4 minutes SVE:   Dilation: 4 Effacement (%): 80 Station: -3 Exam by:: V Mayce Noyes  Vtx too high for AROM at present Pitocin at 10 mu/min  Assessment:  Induction for LGA and favorable cervix GBS negative  Plan: Continue pitocin induction, plan AROM as vtx descends.  Nigel BridgemanLATHAM, Enis Leatherwood CNM 07/19/2014, 2:05 PM

## 2014-07-19 NOTE — Anesthesia Procedure Notes (Signed)
Epidural Patient location during procedure: OB  Staffing Anesthesiologist: Shanavia Makela Performed by: anesthesiologist   Preanesthetic Checklist Completed: patient identified, site marked, surgical consent, pre-op evaluation, timeout performed, IV checked, risks and benefits discussed and monitors and equipment checked  Epidural Patient position: sitting Prep: site prepped and draped and DuraPrep Patient monitoring: continuous pulse ox and blood pressure Approach: midline Location: L3-L4 Injection technique: LOR saline  Needle:  Needle type: Tuohy  Needle gauge: 17 G Needle length: 9 cm and 9 Needle insertion depth: 7 cm Catheter type: closed end flexible Catheter size: 19 Gauge Catheter at skin depth: 11 cm Test dose: negative  Assessment Events: blood not aspirated, injection not painful, no injection resistance, negative IV test and no paresthesia  Additional Notes Patient identified. Risks/Benefits/Options discussed with patient including but not limited to bleeding, infection, nerve damage, paralysis, failed block, incomplete pain control, headache, blood pressure changes, nausea, vomiting, reactions to medication both or allergic, itching and postpartum back pain. Confirmed with bedside nurse the patient's most recent platelet count. Confirmed with patient that they are not currently taking any anticoagulation, have any bleeding history or any family history of bleeding disorders. Patient expressed understanding and wished to proceed. All questions were answered. Sterile technique was used throughout the entire procedure. Please see nursing notes for vital signs. Test dose was given through epidural catheter and negative prior to continuing to dose epidural or start infusion. Warning signs of high block given to the patient including shortness of breath, tingling/numbness in hands, complete motor block, or any concerning symptoms with instructions to call for help. Patient was  given instructions on fall risk and not to get out of bed. All questions and concerns addressed with instructions to call with any issues or inadequate analgesia.      

## 2014-07-20 LAB — CBC
HCT: 35.1 % — ABNORMAL LOW (ref 36.0–46.0)
Hemoglobin: 11.4 g/dL — ABNORMAL LOW (ref 12.0–15.0)
MCH: 30 pg (ref 26.0–34.0)
MCHC: 32.5 g/dL (ref 30.0–36.0)
MCV: 92.4 fL (ref 78.0–100.0)
Platelets: 179 10*3/uL (ref 150–400)
RBC: 3.8 MIL/uL — AB (ref 3.87–5.11)
RDW: 14.3 % (ref 11.5–15.5)
WBC: 11.1 10*3/uL — AB (ref 4.0–10.5)

## 2014-07-20 LAB — HIV ANTIBODY (ROUTINE TESTING W REFLEX): HIV SCREEN 4TH GENERATION: NONREACTIVE

## 2014-07-20 LAB — RPR: RPR: NONREACTIVE

## 2014-07-20 MED ORDER — RHO D IMMUNE GLOBULIN 1500 UNIT/2ML IJ SOSY
300.0000 ug | PREFILLED_SYRINGE | Freq: Once | INTRAMUSCULAR | Status: AC
  Administered 2014-07-20: 300 ug via INTRAVENOUS
  Filled 2014-07-20: qty 2

## 2014-07-20 MED ORDER — IBUPROFEN 600 MG PO TABS: 600.0000 mg | ORAL_TABLET | Freq: Four times a day (QID) | ORAL | Status: DC | PRN

## 2014-07-20 NOTE — Discharge Instructions (Signed)

## 2014-07-20 NOTE — Lactation Note (Signed)
This note was copied from the chart of Loretta Adams. Lactation Consultation Note: experienced BF mom reports she does not have any milk yet and has been giving formula also. Encouraged to always breast feed first then give small amounts of formula if baby is still hungry. Reports baby is latching well with just a little pain at the beginning of the feeding. Baby had formula 1 hour ago and is asleep in bassinet at present. BF brochure given with resources for support after DC. No questions at present. To call prn  Patient Name: Loretta Corky Downsouatif Kinne WUJWJ'XToday's Date: 07/20/2014 Reason for consult: Initial assessment   Maternal Data Formula Feeding for Exclusion: No Does the patient have breastfeeding experience prior to this delivery?: Yes  Feeding   LATCH Score/Interventions                      Lactation Tools Discussed/Used     Consult Status Consult Status: PRN    Pamelia HoitWeeks, Montrae Braithwaite D 07/20/2014, 11:10 AM

## 2014-07-20 NOTE — Anesthesia Postprocedure Evaluation (Signed)
Anesthesia Post Note  Patient: Loretta Adams  Procedure(s) Performed: * No procedures listed *  Anesthesia type: Epidural  Patient location: Mother/Baby  Post pain: Pain level controlled  Post assessment: Post-op Vital signs reviewed  Last Vitals:  Filed Vitals:   07/20/14 0129  BP: 124/82  Pulse: 70  Temp: 36.7 C  Resp: 18    Post vital signs: Reviewed  Level of consciousness:alert  Complications: No apparent anesthesia complications

## 2014-07-20 NOTE — Discharge Summary (Signed)
  Vaginal Delivery Discharge Summary  Loretta Adams  DOB:    12/01/1975 MRN:    [id number] CSN:    [id number]  Date of admission:                  07/19/14  Date of discharge:                   07/20/14  Procedures this admission:   SVB  Date of Delivery: 07/19/14  Newborn Data:  Live born female  Birth Weight: 8 lb 9.7 oz (3905 g) APGAR: 9,   Home with mother. Name: Yousef Circumcision Plan: Outpatient  History of Present Illness:  Loretta Adams is a 39 y.o. female, [phone], who presents at 2265w4d weeks gestation. The [facility]. She was admitted for induction of labor due to favorable cervix and hx LGA.Marland Kitchen. Her pregnancy has been complicated by:  Patient Active Problem List   Diagnosis Date Noted  . LGA (large for gestational age) fetus 07/19/2014  . Marginal insertion of umbilical cord 07/19/2014  . Abnormal maternal serum screening test--elevated risk of Trisomy 18 on screening, declined further testing 07/19/2014  . AMA (advanced maternal age) multigravida 35+ 07/19/2014  . Rh negative status during pregnancy 07/19/2014  . Late prenatal care 07/19/2014  . Hx of delivery by vacuum extraction, currently pregnant--2008 delivery 07/19/2014  . Allergy to amoxicillin 07/19/2014  . Vaginal delivery 07/19/2014     Hospital Course:   Admitting Dx:      IUP at 39 4/7 weeks, favorable cervix, LGA fetus, RH negative, marginal insertion, elevated Trisomy 18 risk on screening GBS Status:  Negative Delivering Clinician:Dayna Geurts Emilee HeroLatham, CNM Lacerations/MLE: 2nd degree perineal Complications: None  Intrapartum Procedures: spontaneous vaginal delivery Postpartum Procedures: Rho(D) Ig Complications-Operative and Postpartum: 2nd degree perineal laceration  Discharge Diagnoses: Term Pregnancy-delivered  Feeding:  breast  Contraception:  Unknown at present  Hemoglobin  Results:  CBC Latest Ref Rng 07/20/2014 07/19/2014 08/04/2008  WBC 4.0 - 10.5 K/uL 11.1(H) 9.3 13.6(H)  Hemoglobin 12.0 - 15.0 g/dL 11.4(L) 11.5(L) 13.2  Hematocrit 36.0 - 46.0 % 35.1(L) 34.9(L) 38.4  Platelets 150 - 400 K/uL 179 171 168    Discharge Physical Exam:   General: alert Lochia: appropriate Uterine Fundus: firm Incision: healing well DVT Evaluation: No evidence of DVT seen on physical exam. Negative Homan's sign.   Discharge Information: 2 Activity:           pelvic rest Diet:                routine Medications: Ibuprofen Condition:      stable Instructions:  Routine pp instructions   Discharge to: home  Follow-up Information    [facility] In 6 weeks.   Specialty:  Obstetrics and Gynecology   Why:  Call to scheduled baby's circumcision.  Call with any questions or concerns.   Contact information:   [address] [place] [phone]       Nigel BridgemanLATHAM, Kloey Cazarez [facility] 07/20/2014 7:55 AM

## 2014-07-21 LAB — RH IG WORKUP (INCLUDES ABO/RH)
ABO/RH(D): O NEG
Fetal Screen: NEGATIVE
GESTATIONAL AGE(WKS): 40
Unit division: 0

## 2014-07-22 ENCOUNTER — Encounter (HOSPITAL_COMMUNITY): Payer: Self-pay

## 2014-07-25 ENCOUNTER — Encounter (HOSPITAL_COMMUNITY): Payer: Self-pay

## 2015-04-12 ENCOUNTER — Ambulatory Visit (INDEPENDENT_AMBULATORY_CARE_PROVIDER_SITE_OTHER): Payer: BLUE CROSS/BLUE SHIELD

## 2015-04-12 ENCOUNTER — Ambulatory Visit (INDEPENDENT_AMBULATORY_CARE_PROVIDER_SITE_OTHER): Payer: BLUE CROSS/BLUE SHIELD | Admitting: Family Medicine

## 2015-04-12 VITALS — BP 118/74 | HR 73 | Temp 97.8°F | Resp 18 | Ht 67.75 in | Wt 206.4 lb

## 2015-04-12 DIAGNOSIS — Z136 Encounter for screening for cardiovascular disorders: Secondary | ICD-10-CM | POA: Diagnosis not present

## 2015-04-12 DIAGNOSIS — G8929 Other chronic pain: Secondary | ICD-10-CM

## 2015-04-12 DIAGNOSIS — Z0000 Encounter for general adult medical examination without abnormal findings: Secondary | ICD-10-CM

## 2015-04-12 DIAGNOSIS — Z1383 Encounter for screening for respiratory disorder NEC: Secondary | ICD-10-CM | POA: Diagnosis not present

## 2015-04-12 DIAGNOSIS — M545 Low back pain: Secondary | ICD-10-CM

## 2015-04-12 DIAGNOSIS — Z1389 Encounter for screening for other disorder: Secondary | ICD-10-CM

## 2015-04-12 DIAGNOSIS — E039 Hypothyroidism, unspecified: Secondary | ICD-10-CM | POA: Diagnosis not present

## 2015-04-12 DIAGNOSIS — Z1329 Encounter for screening for other suspected endocrine disorder: Secondary | ICD-10-CM

## 2015-04-12 DIAGNOSIS — Z130 Encounter for screening for diseases of the blood and blood-forming organs and certain disorders involving the immune mechanism: Secondary | ICD-10-CM | POA: Diagnosis not present

## 2015-04-12 DIAGNOSIS — R319 Hematuria, unspecified: Secondary | ICD-10-CM

## 2015-04-12 DIAGNOSIS — E049 Nontoxic goiter, unspecified: Secondary | ICD-10-CM

## 2015-04-12 LAB — CBC
HEMATOCRIT: 39.2 % (ref 36.0–46.0)
HEMOGLOBIN: 13.1 g/dL (ref 12.0–15.0)
MCH: 31.6 pg (ref 26.0–34.0)
MCHC: 33.4 g/dL (ref 30.0–36.0)
MCV: 94.5 fL (ref 78.0–100.0)
MPV: 11.4 fL (ref 8.6–12.4)
Platelets: 256 10*3/uL (ref 150–400)
RBC: 4.15 MIL/uL (ref 3.87–5.11)
RDW: 12.1 % (ref 11.5–15.5)
WBC: 6.7 10*3/uL (ref 4.0–10.5)

## 2015-04-12 LAB — POCT URINALYSIS DIP (MANUAL ENTRY)
Bilirubin, UA: NEGATIVE
Glucose, UA: NEGATIVE
Nitrite, UA: NEGATIVE
PH UA: 5
Protein Ur, POC: NEGATIVE
Spec Grav, UA: 1.025
Urobilinogen, UA: 0.2

## 2015-04-12 LAB — POC MICROSCOPIC URINALYSIS (UMFC)

## 2015-04-12 LAB — COMPREHENSIVE METABOLIC PANEL
ALBUMIN: 4 g/dL (ref 3.6–5.1)
ALT: 9 U/L (ref 6–29)
AST: 14 U/L (ref 10–30)
Alkaline Phosphatase: 56 U/L (ref 33–115)
BUN: 9 mg/dL (ref 7–25)
CO2: 25 mmol/L (ref 20–31)
Calcium: 8.4 mg/dL — ABNORMAL LOW (ref 8.6–10.2)
Chloride: 109 mmol/L (ref 98–110)
Creat: 0.63 mg/dL (ref 0.50–1.10)
Glucose, Bld: 74 mg/dL (ref 65–99)
Potassium: 4.4 mmol/L (ref 3.5–5.3)
Sodium: 140 mmol/L (ref 135–146)
TOTAL PROTEIN: 6.4 g/dL (ref 6.1–8.1)
Total Bilirubin: 0.4 mg/dL (ref 0.2–1.2)

## 2015-04-12 LAB — THYROID PANEL WITH TSH
Free Thyroxine Index: 1.3 — ABNORMAL LOW (ref 1.4–3.8)
T3 Uptake: 29 % (ref 22–35)
T4, Total: 4.6 ug/dL (ref 4.5–12.0)
TSH: 7.78 m[IU]/L — AB

## 2015-04-12 NOTE — Patient Instructions (Signed)
Back Exercises If you have pain in your back, do these exercises 2-3 times each day or as told by your doctor. When the pain goes away, do the exercises once each day, but repeat the steps more times for each exercise (do more repetitions). If you do not have pain in your back, do these exercises once each day or as told by your doctor. EXERCISES Single Knee to Chest Do these steps 3-5 times in a row for each leg: 1. Lie on your back on a firm bed or the floor with your legs stretched out. 2. Bring one knee to your chest. 3. Hold your knee to your chest by grabbing your knee or thigh. 4. Pull on your knee until you feel a gentle stretch in your lower back. 5. Keep doing the stretch for 10-30 seconds. 6. Slowly let go of your leg and straighten it. Pelvic Tilt Do these steps 5-10 times in a row: 1. Lie on your back on a firm bed or the floor with your legs stretched out. 2. Bend your knees so they point up to the ceiling. Your feet should be flat on the floor. 3. Tighten your lower belly (abdomen) muscles to press your lower back against the floor. This will make your tailbone point up to the ceiling instead of pointing down to your feet or the floor. 4. Stay in this position for 5-10 seconds while you gently tighten your muscles and breathe evenly. Cat-Cow Do these steps until your lower back bends more easily: 1. Get on your hands and knees on a firm surface. Keep your hands under your shoulders, and keep your knees under your hips. You may put padding under your knees. 2. Let your head hang down, and make your tailbone point down to the floor so your lower back is round like the back of a cat. 3. Stay in this position for 5 seconds. 4. Slowly lift your head and make your tailbone point up to the ceiling so your back hangs low (sags) like the back of a cow. 5. Stay in this position for 5 seconds. Press-Ups Do these steps 5-10 times in a row: 1. Lie on your belly (face-down) on the  floor. 2. Place your hands near your head, about shoulder-width apart. 3. While you keep your back relaxed and keep your hips on the floor, slowly straighten your arms to raise the top half of your body and lift your shoulders. Do not use your back muscles. To make yourself more comfortable, you may change where you place your hands. 4. Stay in this position for 5 seconds. 5. Slowly return to lying flat on the floor. Bridges Do these steps 10 times in a row: 1. Lie on your back on a firm surface. 2. Bend your knees so they point up to the ceiling. Your feet should be flat on the floor. 3. Tighten your butt muscles and lift your butt off of the floor until your waist is almost as high as your knees. If you do not feel the muscles working in your butt and the back of your thighs, slide your feet 1-2 inches farther away from your butt. 4. Stay in this position for 3-5 seconds. 5. Slowly lower your butt to the floor, and let your butt muscles relax. If this exercise is too easy, try doing it with your arms crossed over your chest. Belly Crunches Do these steps 5-10 times in a row: 1. Lie on your back on a firm bed  or the floor with your legs stretched out. 2. Bend your knees so they point up to the ceiling. Your feet should be flat on the floor. 3. Cross your arms over your chest. 4. Tip your chin a little bit toward your chest but do not bend your neck. 5. Tighten your belly muscles and slowly raise your chest just enough to lift your shoulder blades a tiny bit off of the floor. 6. Slowly lower your chest and your head to the floor. Back Lifts Do these steps 5-10 times in a row: 1. Lie on your belly (face-down) with your arms at your sides, and rest your forehead on the floor. 2. Tighten the muscles in your legs and your butt. 3. Slowly lift your chest off of the floor while you keep your hips on the floor. Keep the back of your head in line with the curve in your back. Look at the floor while  you do this. 4. Stay in this position for 3-5 seconds. 5. Slowly lower your chest and your face to the floor. GET HELP IF:  Your back pain gets a lot worse when you do an exercise.  Your back pain does not lessen 2 hours after you exercise. If you have any of these problems, stop doing the exercises. Do not do them again unless your doctor says it is okay. GET HELP RIGHT AWAY IF:  You have sudden, very bad back pain. If this happens, stop doing the exercises. Do not do them again unless your doctor says it is okay.   This information is not intended to replace advice given to you by your health care provider. Make sure you discuss any questions you have with your health care provider.   Document Released: 02/06/2010 Document Revised: 09/25/2014 Document Reviewed: 02/28/2014 Elsevier Interactive Patient Education 2016 [place] Maintenance, Female Adopting a healthy lifestyle and getting preventive care can go a long way to promote health and wellness. Talk with your health care provider about what schedule of regular examinations is right for you. This is a good chance for you to check in with your provider about disease prevention and staying healthy. In between checkups, there are plenty of things you can do on your own. Experts have done a lot of research about which lifestyle changes and preventive measures are most likely to keep you healthy. Ask your health care provider for more information. WEIGHT AND DIET  Eat a healthy diet 7. Be sure to include plenty of vegetables, fruits, low-fat dairy products, and lean protein. 8. Do not eat a lot of foods high in solid fats, added sugars, or salt. 9. Get regular exercise. This is one of the most important things you can do for your health. 1. Most adults should exercise for at least 150 minutes each week. The exercise should increase your heart rate and make you sweat (moderate-intensity exercise). 2. Most adults should also do  strengthening exercises at least twice a week. This is in addition to the moderate-intensity exercise.  Maintain a healthy weight 5. Body mass index (BMI) is a measurement that can be used to identify possible weight problems. It estimates body fat based on height and weight. Your health care provider can help determine your BMI and help you achieve or maintain a healthy weight. 48. For females 70 years of age and older:  1. A BMI below 18.5 is considered underweight. 2. A BMI of 18.5 to 24.9 is normal. 3. A BMI of 25  to 29.9 is considered overweight. 4. A BMI of 30 and above is considered obese.  Watch levels of cholesterol and blood lipids 6. You should start having your blood tested for lipids and cholesterol at 38 years of age, then have this test every 5 years. 7. You may need to have your cholesterol levels checked more often if: 1. Your lipid or cholesterol levels are high. 2. You are older than 38 years of age. 3. You are at high risk for heart disease.  CANCER SCREENING   Lung Cancer 6. Lung cancer screening is recommended for adults 84-71 years old who are at high risk for lung cancer because of a history of smoking. 7. A yearly low-dose CT scan of the lungs is recommended for people who: 1. Currently smoke. 2. Have quit within the past 15 years. 3. Have at least a 30-pack-year history of smoking. A pack year is smoking an average of one pack of cigarettes a day for 1 year. 8. Yearly screening should continue until it has been 15 years since you quit. 9. Yearly screening should stop if you develop a health problem that would prevent you from having lung cancer treatment.  Breast Cancer 6. Practice breast self-awareness. This means understanding how your breasts normally appear and feel. 7. It also means doing regular breast self-exams. Let your health care provider know about any changes, no matter how small. 8. If you are in your 20s or 30s, you should have a clinical  breast exam (CBE) by a health care provider every 1-3 years as part of a regular health exam. 9. If you are 40 or older, have a CBE every year. Also consider having a breast X-ray (mammogram) every year. 10. If you have a family history of breast cancer, talk to your health care provider about genetic screening. 11. If you are at high risk for breast cancer, talk to your health care provider about having an MRI and a mammogram every year. 12. Breast cancer gene (BRCA) assessment is recommended for women who have family members with BRCA-related cancers. BRCA-related cancers include: 1. Breast. 2. Ovarian. 3. Tubal. 4. Peritoneal cancers. 13. Results of the assessment will determine the need for genetic counseling and BRCA1 and BRCA2 testing. Cervical Cancer Your health care provider may recommend that you be screened regularly for cancer of the pelvic organs (ovaries, uterus, and vagina). This screening involves a pelvic examination, including checking for microscopic changes to the surface of your cervix (Pap test). You may be encouraged to have this screening done every 3 years, beginning at age 107. 2. For women ages 10-65, health care providers may recommend pelvic exams and Pap testing every 3 years, or they may recommend the Pap and pelvic exam, combined with testing for human papilloma virus (HPV), every 5 years. Some types of HPV increase your risk of cervical cancer. Testing for HPV may also be done on women of any age with unclear Pap test results. 8. Other health care providers may not recommend any screening for nonpregnant women who are considered low risk for pelvic cancer and who do not have symptoms. Ask your health care provider if a screening pelvic exam is right for you. 9. If you have had past treatment for cervical cancer or a condition that could lead to cancer, you need Pap tests and screening for cancer for at least 20 years after your treatment. If Pap tests have been  discontinued, your risk factors (such as having a new sexual partner)  need to be reassessed to determine if screening should resume. Some women have medical problems that increase the chance of getting cervical cancer. In these cases, your health care provider may recommend more frequent screening and Pap tests. Colorectal Cancer 6. This type of cancer can be detected and often prevented. 7. Routine colorectal cancer screening usually begins at 38 years of age and continues through 38 years of age. 8. Your health care provider may recommend screening at an earlier age if you have risk factors for colon cancer. 9. Your health care provider may also recommend using home test kits to check for hidden blood in the stool. 10. A small camera at the end of a tube can be used to examine your colon directly (sigmoidoscopy or colonoscopy). This is done to check for the earliest forms of colorectal cancer. 11. Routine screening usually begins at age 21. 12. Direct examination of the colon should be repeated every 5-10 years through 38 years of age. However, you may need to be screened more often if early forms of precancerous polyps or small growths are found. Skin Cancer  Check your skin from head to toe regularly.  Tell your health care provider about any new moles or changes in moles, especially if there is a change in a mole's shape or color.  Also tell your health care provider if you have a mole that is larger than the size of a pencil eraser.  Always use sunscreen. Apply sunscreen liberally and repeatedly throughout the day.  Protect yourself by wearing long sleeves, pants, a wide-brimmed hat, and sunglasses whenever you are outside. HEART DISEASE, DIABETES, AND HIGH BLOOD PRESSURE   High blood pressure causes heart disease and increases the risk of stroke. High blood pressure is more likely to develop in:  People who have blood pressure in the high end of the normal range (130-139/85-89 mm  Hg).  People who are overweight or obese.  People who are African American.  If you are 48-11 years of age, have your blood pressure checked every 3-5 years. If you are 69 years of age or older, have your blood pressure checked every year. You should have your blood pressure measured twice--once when you are at a hospital or clinic, and once when you are not at a hospital or clinic. Record the average of the two measurements. To check your blood pressure when you are not at a hospital or clinic, you can use:  An automated blood pressure machine at a pharmacy.  A home blood pressure monitor.  If you are between 40 years and 80 years old, ask your health care provider if you should take aspirin to prevent strokes.  Have regular diabetes screenings. This involves taking a blood sample to check your fasting blood sugar level.  If you are at a normal weight and have a low risk for diabetes, have this test once every three years after 38 years of age.  If you are overweight and have a high risk for diabetes, consider being tested at a younger age or more often. PREVENTING INFECTION  Hepatitis B  If you have a higher risk for hepatitis B, you should be screened for this virus. You are considered at high risk for hepatitis B if:  You were born in a country where hepatitis B is common. Ask your health care provider which countries are considered high risk.  Your parents were born in a high-risk country, and you have not been immunized against hepatitis B (hepatitis  B vaccine).  You have HIV or AIDS.  You use needles to inject street drugs.  You live with someone who has hepatitis B.  You have had sex with someone who has hepatitis B.  You get hemodialysis treatment.  You take certain medicines for conditions, including cancer, organ transplantation, and autoimmune conditions. Hepatitis C  Blood testing is recommended for:  Everyone born from [birth year] through 1963.  Anyone with known  risk factors for hepatitis C. Sexually transmitted infections (STIs)  You should be screened for sexually transmitted infections (STIs) including gonorrhea and chlamydia if:  You are sexually active and are younger than 38 years of age.  You are older than 38 years of age and your health care provider tells you that you are at risk for this type of infection.  Your sexual activity has changed since you were last screened and you are at an increased risk for chlamydia or gonorrhea. Ask your health care provider if you are at risk.  If you do not have HIV, but are at risk, it may be recommended that you take a prescription medicine daily to prevent HIV infection. This is called pre-exposure prophylaxis (PrEP). You are considered at risk if:  You are sexually active and do not regularly use condoms or know the HIV status of your partner(s).  You take drugs by injection.  You are sexually active with a partner who has HIV. Talk with your health care provider about whether you are at high risk of being infected with HIV. If you choose to begin PrEP, you should first be tested for HIV. You should then be tested every 3 months for as long as you are taking PrEP.  PREGNANCY   If you are premenopausal and you may become pregnant, ask your health care provider about preconception counseling.  If you may become pregnant, take 400 to 800 micrograms (mcg) of folic acid every day.  If you want to prevent pregnancy, talk to your health care provider about birth control (contraception). OSTEOPOROSIS AND MENOPAUSE   Osteoporosis is a disease in which the bones lose minerals and strength with aging. This can result in serious bone fractures. Your risk for osteoporosis can be identified using a bone density scan.  If you are 72 years of age or older, or if you are at risk for osteoporosis and fractures, ask your health care provider if you should be screened.  Ask your health care provider whether you  should take a calcium or vitamin D supplement to lower your risk for osteoporosis.  Menopause may have certain physical symptoms and risks.  Hormone replacement therapy may reduce some of these symptoms and risks. Talk to your health care provider about whether hormone replacement therapy is right for you.  HOME CARE INSTRUCTIONS   Schedule regular health, dental, and eye exams.  Stay current with your immunizations.   Do not use any tobacco products including cigarettes, chewing tobacco, or electronic cigarettes.  If you are pregnant, do not drink alcohol.  If you are breastfeeding, limit how much and how often you drink alcohol.  Limit alcohol intake to no more than 1 drink per day for nonpregnant women. One drink equals 12 ounces of beer, 5 ounces of wine, or 1 ounces of hard liquor.  Do not use street drugs.  Do not share needles.  Ask your health care provider for help if you need support or information about quitting drugs.  Tell your health care provider if you often feel  depressed.  Tell your health care provider if you have ever been abused or do not feel safe at home.   This information is not intended to replace advice given to you by your health care provider. Make sure you discuss any questions you have with your health care provider.   Document Released: 07/20/2010 Document Revised: 01/25/2014 Document Reviewed: 12/06/2012 Elsevier Interactive Patient Education [employer].

## 2015-04-12 NOTE — Progress Notes (Signed)
Subjective:  By signing my name below, I, Raven Small, attest that this documentation has been prepared under the direction and in the presence of Norberto SorensonEva Zealand Boyett, MD.  Electronically Signed: Andrew Auaven Small, ED Scribe. 04/12/2015. 2:28 PM.   Patient ID: Loretta Adams, female    DOB: 02/05/1975, 40 y.o.   MRN: [id number]  HPI Chief Complaint  Patient presents with  . Annual Exam    CPE with NO pap    HPI Comments: Loretta Adams is a 40 y.o. female who presents to the [facility] for a physical.  She c/o gradually worsening, constant low back pain. Pt states she's had this pain for a while but it became constant about 3-4 months ago. She dates pain back during the time of her most recent pregnancy, after having her child in 07/2014 back pain gradually worsened. She notes receiving an epidural during child birth. She denies injury or fall. She reports pain at night when trying to get comfortable and trouble getting out of bed due to pain. She denies radiating pain down LE as well as numbness. She does mention bilateral leg weakness at one point due to pain. She reports BM every day or every other day and that she is occasionally constipated. She denies bladder or bowel incontinence, dysuria, numbness and tingling. She is on IUD.  Pt did not get flu shot this year.   Pt is not fasting.   Patient Active Problem List   Diagnosis Date Noted  . LGA (large for gestational age) fetus 07/19/2014  . Marginal insertion of umbilical cord 07/19/2014  . Abnormal maternal serum screening test--elevated risk of Trisomy 18 on screening, declined further testing 07/19/2014  . AMA (advanced maternal age) multigravida 35+ 07/19/2014  . Rh negative status during pregnancy 07/19/2014  . Late prenatal care 07/19/2014  . Hx of delivery by vacuum extraction, currently pregnant--2008 delivery 07/19/2014  . Allergy to amoxicillin 07/19/2014  . Vaginal delivery 07/19/2014   History reviewed. No  pertinent past medical history. Past Surgical History  Procedure Laterality Date  . Multiple tooth extractions     Allergies  Allergen Reactions  . Amoxicillin Palpitations   Prior to Admission medications   Medication Sig Start Date End Date Taking? Authorizing Provider  ibuprofen (ADVIL,MOTRIN) 600 MG tablet Take 1 tablet (600 mg total) by mouth every 6 (six) hours as needed. Patient not taking: Reported on 04/12/2015 07/20/14   Nigel BridgemanVicki Latham, CNM   Social History   Social History  . Marital Status: Married    Spouse Name: N/A  . Number of Children: N/A  . Years of Education: N/A   Occupational History  . Not on file.   Social History Main Topics  . Smoking status: Never Smoker   . Smokeless tobacco: Never Used  . Alcohol Use: No  . Drug Use: No  . Sexual Activity:    Partners: Male    Birth Control/ Protection: IUD   Other Topics Concern  . Not on file   Social History Narrative   Family History  Problem Relation Age of Onset  . Asthma Maternal Grandmother   . Diabetes Maternal Grandmother   . Heart disease Maternal Grandmother   . Hyperlipidemia Maternal Grandmother   . Hypertension Maternal Grandmother   '  Review of Systems  Constitutional: Negative for fever and chills.  HENT: Negative for congestion, sinus pressure and sore throat.   Gastrointestinal: Positive for constipation. Negative for diarrhea.  Genitourinary: Negative for dysuria, enuresis and  difficulty urinating.  Musculoskeletal: Positive for myalgias and back pain. Negative for gait problem.  Allergic/Immunologic: Negative for environmental allergies.  Neurological: Positive for weakness. Negative for numbness.  All other systems reviewed and are negative.   Objective:   Physical Exam  Constitutional: She is oriented to person, place, and time. She appears well-developed and well-nourished. No distress.  HENT:  Head: Normocephalic and atraumatic.  Left Ear: Tympanic membrane is injected.    Right ear with cerumen  Eyes: Conjunctivae and EOM are normal.  Neck: Neck supple. Thyromegaly present.  Cardiovascular: Normal rate.   Murmur ( right upper sternal border) heard.  Systolic ( injected) murmur is present with a grade of 2/6  Pulmonary/Chest: Effort normal. She has no decreased breath sounds. She has no wheezes. She has no rhonchi. She has no rales.  Abdominal: Soft. Bowel sounds are normal. She exhibits no distension. There is no hepatosplenomegaly. There is no tenderness.  Musculoskeletal: Normal range of motion.  Pain to midline around lower thoracic upper l. Paraspinal upper sacral region. No spinous process tenderness. No SI tenderness. Full flexion moderate  to severe limitation of extension with pain. Moderate limitation lateral flexion.   Lymphadenopathy:    She has no cervical adenopathy.       Right: No supraclavicular adenopathy present.       Left: No supraclavicular adenopathy present.  Neurological: She is alert and oriented to person, place, and time.  Reflex Scores:      Patellar reflexes are 2+ on the right side and 2+ on the left side. 5/5 strength bilaterally.  Skin: Skin is warm and dry.  Psychiatric: She has a normal mood and affect. Her behavior is normal.  Nursing note and vitals reviewed.  Filed Vitals:   04/12/15 1414  BP: 118/74  Pulse: 73  Temp: 97.8 F (36.6 C)  TempSrc: Oral  Resp: 18  Height: 5' 7.75" (1.721 m)  Weight: 206 lb 6.4 oz (93.622 kg)  SpO2: 99%    Results for orders placed or performed in visit on 04/12/15  POCT Microscopic Urinalysis ([facility])  Result Value Ref Range   WBC,UR,HPF,POC None None WBC/hpf   RBC,UR,HPF,POC None None RBC/hpf   Bacteria None None, Too numerous to count   Mucus Present (A) Absent   Epithelial Cells, UR Per Microscopy Few (A) None, Too numerous to count cells/hpf  POCT urinalysis dipstick  Result Value Ref Range   Color, UA yellow yellow   Clarity, UA clear clear   Glucose, UA negative  negative   Bilirubin, UA negative negative   Ketones, POC UA trace (5) (A) negative   Spec Grav, UA 1.025    Blood, UA moderate (A) negative   pH, UA 5.0    Protein Ur, POC negative negative   Urobilinogen, UA 0.2    Nitrite, UA Negative Negative   Leukocytes, UA Trace (A) Negative   Dg Lumbar Spine Complete  04/12/2015  CLINICAL DATA:  40 year old female with a history of lumbar back pain. EXAM: LUMBAR SPINE - COMPLETE 4+ VIEW COMPARISON:  None. FINDINGS: Lumbar Spine: Lumbar vertebral elements maintain normal alignment without evidence of anterolisthesis, retrolisthesis, subluxation. No fracture line identified.  Vertebral body heights maintained. Disc space narrowing at L5-S1. Unremarkable appearance of the visualized abdomen. Oblique images demonstrate no displaced pars defect. IUD projects over the anatomic pelvis. IMPRESSION: No radiographic evidence of acute fracture or malalignment of the lumbar spine. Early degenerative disc changes at L5-S1. Signed, Yvone Neu. Loreta Ave, DO [facility] Electronically Signed   By: Gilmer Mor D.O.   On: 04/12/2015 15:30    Assessment & Plan:   1. Annual physical exam   2. Screening for cardiovascular, respiratory, and genitourinary diseases   3. Screening for deficiency anemia   4. Screening for thyroid disorder   5. Chronic bilateral low back pain without sciatica - start home exercises and refer to PT - needs core strengthening, does have some mild deg change.  6. Enlarged thyroid   7. Hematuria - recheck at f/u OV in 6 wks, push fluids.  8. Hypothyroidism, unspecified hypothyroidism type - new diagnosis - suspect Hashimoto's thyroiditis with goiter - proceed with thyroid US and start levothyroxine 50 mcg. Recheck in 6 wks. Try to add on tpo antibodies but if not able to add on to blood in lab, check at f/u OV in 6 wks.    Orders Placed This Encounter  Procedures  . Urine culture  . DG  Lumbar Spine Complete    Standing Status: Future     Number of Occurrences: 1     Standing Expiration Date: 04/11/2016    Order Specific Question:  Reason for Exam (SYMPTOM  OR DIAGNOSIS REQUIRED)    Answer:  worsening low back pain over 1 yr with core weakeness, intermittent    Order Specific Question:  Is the patient pregnant?    Answer:  No    Order Specific Question:  Preferred imaging location?    Answer:  External  . US Soft Tissue Head/Neck    Standing Status: Future     Number of Occurrences:      Standing Expiration Date: 06/11/2016    Order Specific Question:  Reason for Exam (SYMPTOM  OR DIAGNOSIS REQUIRED)    Answer:  goiter    Order Specific Question:  Preferred imaging location?    Answer:  External  . CBC  . Comprehensive metabolic panel  . Thyroid Panel With TSH  . Ambulatory referral to Physical Therapy    Referral Priority:  Routine    Referral Type:  Physical Medicine    Referral Reason:  Specialty Services Required    Requested Specialty:  Physical Therapy    Number of Visits Requested:  1  . POCT Microscopic Urinalysis ([facility])  . POCT urinalysis dipstick    Meds ordered this encounter  Medications  . levothyroxine (SYNTHROID) 50 MCG tablet    Sig: Take 1 tablet (50 mcg total) by mouth daily before breakfast.    Dispense:  30 tablet    Refill:  1    I personally performed the services described in this documentation, which was scribed in my presence. The recorded information has been reviewed and considered, and addended by me as needed.  Norberto Sorenson, MD MPH

## 2015-04-13 LAB — URINE CULTURE: Colony Count: 35000

## 2015-04-14 DIAGNOSIS — E039 Hypothyroidism, unspecified: Secondary | ICD-10-CM | POA: Insufficient documentation

## 2015-04-14 MED ORDER — LEVOTHYROXINE SODIUM 50 MCG PO TABS: 50.0000 ug | ORAL_TABLET | Freq: Every day | ORAL | Status: AC

## 2016-01-05 ENCOUNTER — Telehealth: Payer: Self-pay

## 2016-01-05 NOTE — Telephone Encounter (Signed)
Patient dropped off a form for Loretta Adams to fill out.  She was seen for a CPE 04/12/15.  Paperwork has been placed in the nurses box.  [phone]

## 2016-01-08 ENCOUNTER — Ambulatory Visit (INDEPENDENT_AMBULATORY_CARE_PROVIDER_SITE_OTHER): Payer: BLUE CROSS/BLUE SHIELD | Admitting: Emergency Medicine

## 2016-01-08 VITALS — BP 118/66 | HR 78 | Temp 98.6°F | Resp 16 | Ht 67.75 in | Wt 217.0 lb

## 2016-01-08 DIAGNOSIS — M545 Low back pain, unspecified: Secondary | ICD-10-CM | POA: Insufficient documentation

## 2016-01-08 DIAGNOSIS — G8929 Other chronic pain: Secondary | ICD-10-CM | POA: Insufficient documentation

## 2016-01-08 NOTE — Patient Instructions (Signed)
     IF you received an x-ray today, you will receive an invoice from [facility]. Please contact [facility] at [phone] with questions or concerns regarding your invoice.   IF you received labwork today, you will receive an invoice from [facility]. Please contact [facility] at [phone] with questions or concerns regarding your invoice.   Our billing staff will not be able to assist you with questions regarding bills from these companies.  You will be contacted with the lab results as soon as they are available. The fastest way to get your results is to activate your My Chart account. Instructions are located on the last page of this paperwork. If you have not heard from us regarding the results in 2 weeks, please contact this office.     

## 2016-01-08 NOTE — Progress Notes (Signed)
   Subjective:    Patient ID: Loretta Adams, female    DOB: 08/15/1975, 40 y.o.   MRN: [id number]  Has no complaints. Needs form filled out. States back pain is gone.      Review of Systems  Constitutional: Negative.  Negative for chills and fever.  HENT: Negative.  Negative for sore throat and trouble swallowing.   Eyes: Negative.  Negative for pain and redness.  Respiratory: Negative.  Negative for chest tightness and shortness of breath.   Cardiovascular: Negative.  Negative for chest pain and palpitations.  Gastrointestinal: Negative.  Negative for abdominal pain, constipation, diarrhea, nausea and vomiting.  Endocrine: Negative.   Genitourinary: Negative.  Negative for dysuria, flank pain and hematuria.  Musculoskeletal: Negative.  Negative for arthralgias, back pain, joint swelling and myalgias.  Skin: Negative.  Negative for pallor and rash.  Neurological: Negative.  Negative for dizziness, syncope, light-headedness and headaches.  Hematological: Negative.   Psychiatric/Behavioral: Negative.        Objective:   Physical Exam  Constitutional: She is oriented to person, place, and time. She appears well-developed and well-nourished. She is active and cooperative. No distress.  BP 118/66   Pulse 78   Temp 98.6 F (37 C) (Oral)   Resp 16   Ht 5' 7.75" (1.721 m)   Wt 217 lb (98.4 kg)   SpO2 99%   BMI 33.24 kg/m    HENT:  Head: Normocephalic and atraumatic.  Right Ear: Hearing normal.  Left Ear: Hearing normal.  Nose: Nose normal.  Mouth/Throat: Uvula is midline, oropharynx is clear and moist and mucous membranes are normal.  Eyes: Conjunctivae, EOM and lids are normal. Pupils are equal, round, and reactive to light.  Neck: Trachea normal and normal range of motion. Neck supple. Normal carotid pulses and no JVD present. Carotid bruit is not present. No thyroid mass and no thyromegaly present.  Cardiovascular: Normal rate, regular rhythm and normal heart sounds.     Pulmonary/Chest: Effort normal and breath sounds normal.  Abdominal: Soft. Normal appearance. There is no hepatosplenomegaly. There is no tenderness. There is no rebound and no CVA tenderness.  Neurological: She is alert and oriented to person, place, and time. No sensory deficit.  Skin: Skin is warm and dry.  Psychiatric: She has a normal mood and affect. Her speech is not slurred.  Vitals reviewed.         Assessment & Plan:  Loretta Adams was seen today for follow-up.  Diagnoses and all orders for this visit:  Chronic bilateral low back pain without sciatica   No specific treatment needed at this time. F/U as needed.

## 2016-01-13 NOTE — Telephone Encounter (Signed)
Pt came back to be seen on 12/21 to have form completed.

## 2016-05-22 ENCOUNTER — Encounter: Payer: Self-pay | Admitting: Family Medicine

## 2016-05-22 ENCOUNTER — Ambulatory Visit (INDEPENDENT_AMBULATORY_CARE_PROVIDER_SITE_OTHER): Payer: BLUE CROSS/BLUE SHIELD | Admitting: Family Medicine

## 2016-05-22 VITALS — BP 119/78 | HR 84 | Temp 98.9°F | Resp 16 | Ht 67.5 in | Wt 207.0 lb

## 2016-05-22 DIAGNOSIS — Z13220 Encounter for screening for lipoid disorders: Secondary | ICD-10-CM | POA: Diagnosis not present

## 2016-05-22 DIAGNOSIS — Z131 Encounter for screening for diabetes mellitus: Secondary | ICD-10-CM

## 2016-05-22 DIAGNOSIS — J9801 Acute bronchospasm: Secondary | ICD-10-CM

## 2016-05-22 DIAGNOSIS — Z0000 Encounter for general adult medical examination without abnormal findings: Secondary | ICD-10-CM | POA: Diagnosis not present

## 2016-05-22 DIAGNOSIS — R05 Cough: Secondary | ICD-10-CM | POA: Diagnosis not present

## 2016-05-22 DIAGNOSIS — E010 Iodine-deficiency related diffuse (endemic) goiter: Secondary | ICD-10-CM | POA: Diagnosis not present

## 2016-05-22 DIAGNOSIS — R059 Cough, unspecified: Secondary | ICD-10-CM

## 2016-05-22 LAB — POCT URINALYSIS DIP (MANUAL ENTRY)
Bilirubin, UA: NEGATIVE
Glucose, UA: NEGATIVE mg/dL
Ketones, POC UA: NEGATIVE mg/dL
Nitrite, UA: NEGATIVE
Protein Ur, POC: NEGATIVE mg/dL
Spec Grav, UA: <=1.005 — AB (ref 1.010–1.025)
Urobilinogen, UA: 0.2 E.U./dL
pH, UA: 5.5 (ref 5.0–8.0)

## 2016-05-22 LAB — POCT CBC
Granulocyte percent: 75.5 %G (ref 37–80)
HCT, POC: 37.1 % — AB (ref 37.7–47.9)
Hemoglobin: 12.6 g/dL (ref 12.2–16.2)
Lymph, poc: 1.9 (ref 0.6–3.4)
MCH, POC: 30.7 pg (ref 27–31.2)
MCHC: 33.8 g/dL (ref 31.8–35.4)
MCV: 90.6 fL (ref 80–97)
MID (cbc): 0.8 (ref 0–0.9)
MPV: 9.3 fL (ref 0–99.8)
PLATELET COUNT, POC: 308 10*3/uL (ref 142–424)
POC Granulocyte: 8.3 — AB (ref 2–6.9)
POC LYMPH PERCENT: 17.3 %L (ref 10–50)
POC MID %: 7.2 % (ref 0–12)
RBC: 4.1 M/uL (ref 4.04–5.48)
RDW, POC: 12.8 %
WBC: 11 10*3/uL — AB (ref 4.6–10.2)

## 2016-05-22 MED ORDER — ALBUTEROL SULFATE (2.5 MG/3ML) 0.083% IN NEBU
2.5000 mg | INHALATION_SOLUTION | Freq: Once | RESPIRATORY_TRACT | Status: AC
  Administered 2016-05-22: 2.5 mg via RESPIRATORY_TRACT

## 2016-05-22 MED ORDER — AZITHROMYCIN 250 MG PO TABS: ORAL_TABLET | ORAL | 0 refills | Status: DC

## 2016-05-22 NOTE — Progress Notes (Signed)
Subjective:    Patient ID: Loretta DownsAouatif Galasso, female    DOB: 01/27/1975, 41 y.o.   MRN: [id number]  05/22/2016  Annual Exam   HPI This 41 y.o. female presents for Complete Physical Examination.  Last physical:  04-12-15 Pap smear: 01-02-2013  WNL, 09/2015; IUD for ten years Eye exam:  never Dental exam:  Every six months.  There is no immunization history for the selected administration types on file for this patient. BP Readings from Last 3 Encounters:  05/22/16 119/78  01/08/16 118/66  04/12/15 118/74   Wt Readings from Last 3 Encounters:  05/22/16 207 lb (93.9 kg)  01/08/16 217 lb (98.4 kg)  04/12/15 206 lb 6.4 oz (93.6 kg)     Frequent cough and fever; last week and this week.  No allergies; no sneezing.  Onset two weeks ago.  +chest tightness.  Drinking a lot of water; still producing a lot of water.     Review of Systems  Constitutional: Positive for fever. Negative for activity change, appetite change, chills, diaphoresis, fatigue and unexpected weight change.  HENT: Negative for congestion, dental problem, drooling, ear discharge, ear pain, facial swelling, hearing loss, mouth sores, nosebleeds, postnasal drip, rhinorrhea, sinus pressure, sneezing, sore throat, tinnitus, trouble swallowing and voice change.   Eyes: Negative for photophobia, pain, discharge, redness, itching and visual disturbance.  Respiratory: Positive for cough. Negative for apnea, choking, chest tightness, shortness of breath, wheezing and stridor.   Cardiovascular: Negative for chest pain, palpitations and leg swelling.  Gastrointestinal: Negative for abdominal distention, abdominal pain, anal bleeding, blood in stool, constipation, diarrhea, nausea, rectal pain and vomiting.  Endocrine: Negative for cold intolerance, heat intolerance, polydipsia, polyphagia and polyuria.  Genitourinary: Negative for decreased urine volume, difficulty urinating, dyspareunia, dysuria, enuresis, flank pain, frequency,  genital sores, hematuria, menstrual problem, pelvic pain, urgency, vaginal bleeding, vaginal discharge and vaginal pain.  Musculoskeletal: Negative for arthralgias, back pain, gait problem, joint swelling, myalgias, neck pain and neck stiffness.  Skin: Negative for color change, pallor, rash and wound.  Allergic/Immunologic: Negative for environmental allergies, food allergies and immunocompromised state.  Neurological: Negative for dizziness, tremors, seizures, syncope, facial asymmetry, speech difficulty, weakness, light-headedness, numbness and headaches.  Hematological: Negative for adenopathy. Does not bruise/bleed easily.  Psychiatric/Behavioral: Negative for agitation, behavioral problems, confusion, decreased concentration, dysphoric mood, hallucinations, self-injury, sleep disturbance and suicidal ideas. The patient is not nervous/anxious and is not hyperactive.        Bedtime 9:30-10:00pm; wakes up 6:00am.     No past medical history on file. Past Surgical History:  Procedure Laterality Date  . MULTIPLE TOOTH EXTRACTIONS     Allergies  Allergen Reactions  . Amoxicillin Palpitations    Social History   Social History  . Marital status: Married    Spouse name: N/A  . Number of children: N/A  . Years of education: N/A   Occupational History  . Not on file.   Social History Main Topics  . Smoking status: Never Smoker  . Smokeless tobacco: Never Used  . Alcohol use No  . Drug use: No  . Sexual activity: Yes    Partners: Male    Birth control/ protection: IUD   Other Topics Concern  . Not on file   Social History Narrative   Marital status: married x 2002; from [place]      Children: 4 children (11, 9, 7, 2)      Lives: with husband, 4 children      Employment: pre-K Runner, broadcasting/film/videoteacher  Tobacco: none      Alcohol: none      Drugs: none      Exercise:    Family History  Problem Relation Age of Onset  . Asthma Maternal Grandmother   . Diabetes Maternal Grandmother     . Heart disease Maternal Grandmother   . Hyperlipidemia Maternal Grandmother   . Hypertension Maternal Grandmother        Objective:    BP 119/78   Pulse 84   Temp 98.9 F (37.2 C)   Resp 16   Ht 5' 7.5" (1.715 m)   Wt 207 lb (93.9 kg)   LMP 05/08/2016   SpO2 95%   BMI 31.94 kg/m  Physical Exam  Constitutional: She is oriented to person, place, and time. She appears well-developed and well-nourished. No distress.  HENT:  Head: Normocephalic and atraumatic.  Right Ear: External ear normal.  Left Ear: External ear normal.  Nose: Nose normal.  Mouth/Throat: Oropharynx is clear and moist.  Eyes: Conjunctivae and EOM are normal. Pupils are equal, round, and reactive to light.  Neck: Normal range of motion and full passive range of motion without pain. Neck supple. No JVD present. Carotid bruit is not present. No thyromegaly present.  Cardiovascular: Normal rate, regular rhythm and normal heart sounds.  Exam reveals no gallop and no friction rub.   No murmur heard. Pulmonary/Chest: Effort normal and breath sounds normal. She has no wheezes. She has no rales.  Abdominal: Soft. Bowel sounds are normal. She exhibits no distension and no mass. There is no tenderness. There is no rebound and no guarding.  Musculoskeletal:       Right shoulder: Normal.       Left shoulder: Normal.       Cervical back: Normal.  Lymphadenopathy:    She has no cervical adenopathy.  Neurological: She is alert and oriented to person, place, and time. She has normal reflexes. No cranial nerve deficit. She exhibits normal muscle tone. Coordination normal.  Skin: Skin is warm and dry. No rash noted. She is not diaphoretic. No erythema. No pallor.  Psychiatric: She has a normal mood and affect. Her behavior is normal. Judgment and thought content normal.  Nursing note and vitals reviewed.  Results for orders placed or performed in visit on 05/22/16  Comprehensive metabolic panel  Result Value Ref Range    Glucose 97 65 - 99 mg/dL   BUN 5 (L) 6 - 20 mg/dL   Creatinine, Ser 1.61 0.57 - 1.00 mg/dL   GFR calc non Af Amer 111 >59 mL/min/1.73   GFR calc Af Amer 127 >59 mL/min/1.73   BUN/Creatinine Ratio 7 (L) 9 - 23   Sodium 140 134 - 144 mmol/L   Potassium 3.9 3.5 - 5.2 mmol/L   Chloride 102 96 - 106 mmol/L   CO2 22 18 - 29 mmol/L   Calcium 8.9 8.7 - 10.2 mg/dL   Total Protein 7.0 6.0 - 8.5 g/dL   Albumin 4.0 3.5 - 5.5 g/dL   Globulin, Total 3.0 1.5 - 4.5 g/dL   Albumin/Globulin Ratio 1.3 1.2 - 2.2   Bilirubin Total 0.3 0.0 - 1.2 mg/dL   Alkaline Phosphatase 83 39 - 117 IU/L   AST 28 0 - 40 IU/L   ALT 47 (H) 0 - 32 IU/L  Hemoglobin A1c  Result Value Ref Range   Hgb A1c MFr Bld 5.2 4.8 - 5.6 %   Est. average glucose Bld gHb Est-mCnc 103 mg/dL  Lipid panel  Result Value Ref Range   Cholesterol, Total 161 100 - 199 mg/dL   Triglycerides 76 0 - 149 mg/dL   HDL 41 >16 mg/dL   VLDL Cholesterol Cal 15 5 - 40 mg/dL   LDL Calculated 109 (H) 0 - 99 mg/dL   Chol/HDL Ratio 3.9 0.0 - 4.4 ratio  TSH  Result Value Ref Range   TSH 6.600 (H) 0.450 - 4.500 uIU/mL  T4, free  Result Value Ref Range   Free T4 0.96 0.82 - 1.77 ng/dL  POCT urinalysis dipstick  Result Value Ref Range   Color, UA yellow yellow   Clarity, UA clear clear   Glucose, UA negative negative mg/dL   Bilirubin, UA negative negative   Ketones, POC UA negative negative mg/dL   Spec Grav, UA <=6.045 (A) 1.010 - 1.025   Blood, UA trace-intact (A) negative   pH, UA 5.5 5.0 - 8.0   Protein Ur, POC negative negative mg/dL   Urobilinogen, UA 0.2 0.2 or 1.0 E.U./dL   Nitrite, UA Negative Negative   Leukocytes, UA Trace (A) Negative  POCT CBC  Result Value Ref Range   WBC 11.0 (A) 4.6 - 10.2 K/uL   Lymph, poc 1.9 0.6 - 3.4   POC LYMPH PERCENT 17.3 10 - 50 %L   MID (cbc) 0.8 0 - 0.9   POC MID % 7.2 0 - 12 %M   POC Granulocyte 8.3 (A) 2 - 6.9   Granulocyte percent 75.5 37 - 80 %G   RBC 4.10 4.04 - 5.48 M/uL   Hemoglobin  12.6 12.2 - 16.2 g/dL   HCT, POC 40.9 (A) 81.1 - 47.9 %   MCV 90.6 80 - 97 fL   MCH, POC 30.7 27 - 31.2 pg   MCHC 33.8 31.8 - 35.4 g/dL   RDW, POC 91.4 %   Platelet Count, POC 308 142 - 424 K/uL   MPV 9.3 0 - 99.8 fL       Assessment & Plan:   1. Routine physical examination   2. Thyromegaly   3. Screening, lipid   4. Screening for diabetes mellitus   5. Cough   6. Bronchospasm    -anticipatory guidance provided --- exercise, weight loss, safe driving practices, safe driving practices. -obtain age appropriate screening labs and labs for chronic disease management. -refer for thyroid US due to thyromegaly associated with hypothyroidism. -s/p Albuterol nebulizer in the office for bronchospasm acute.  Rx for Zpack due to persistent fever and cough; if no improvement with this treatment plan, RTC for CXR.    Orders Placed This Encounter  Procedures  . US THYROID    Standing Status:   Future    Standing Expiration Date:   07/22/2017    Order Specific Question:   Reason for Exam (SYMPTOM  OR DIAGNOSIS REQUIRED)    Answer:   thyromegaly, TSH slightly elevated    Order Specific Question:   Preferred imaging location?    Answer:   [facility]  . Comprehensive metabolic panel    Order Specific Question:   Has the patient fasted?    Answer:   Yes  . Hemoglobin A1c  . Lipid panel    Order Specific Question:   Has the patient fasted?    Answer:   Yes  . TSH  . T4, free  . POCT urinalysis dipstick  . POCT CBC   Meds ordered this encounter  Medications  . albuterol (PROVENTIL) (2.5 MG/3ML) 0.083% nebulizer solution 2.5 mg  .  azithromycin (ZITHROMAX) 250 MG tablet    Sig: Two tablets daily x 1 day then one tablet daily x 4 days    Dispense:  6 tablet    Refill:  0    No Follow-up on file.   Rhenda Oregon Paulita Fujita, M.D. Primary Care at [facility] previously [facility] [address] [place], [place]  [phone] phone [phone] fax

## 2016-05-22 NOTE — Patient Instructions (Addendum)
   IF you received an x-ray today, you will receive an invoice from [facility]. Please contact  Radiology at [phone] with questions or concerns regarding your invoice.   IF you received labwork today, you will receive an invoice from [facility]. Please contact [facility] at [phone] with questions or concerns regarding your invoice.   Our billing staff will not be able to assist you with questions regarding bills from these companies.  You will be contacted with the lab results as soon as they are available. The fastest way to get your results is to activate your My Chart account. Instructions are located on the last page of this paperwork. If you have not heard from us regarding the results in 2 weeks, please contact this office.      Preventive Care 18-39 Years, Female Preventive care refers to lifestyle choices and visits with your health care provider that can promote health and wellness. What does preventive care include?  A yearly physical exam. This is also called an annual well check.  Dental exams once or twice a year.  Routine eye exams. Ask your health care provider how often you should have your eyes checked.  Personal lifestyle choices, including:  Daily care of your teeth and gums.  Regular physical activity.  Eating a healthy diet.  Avoiding tobacco and drug use.  Limiting alcohol use.  Practicing safe sex.  Taking vitamin and mineral supplements as recommended by your health care provider. What happens during an annual well check? The services and screenings done by your health care provider during your annual well check will depend on your age, overall health, lifestyle risk factors, and family history of disease. Counseling  Your health care provider may ask you questions about your:  Alcohol use.  Tobacco use.  Drug use.  Emotional well-being.  Home and relationship well-being.  Sexual activity.  Eating  habits.  Work and work environment.  Method of birth control.  Menstrual cycle.  Pregnancy history. Screening  You may have the following tests or measurements:  Height, weight, and BMI.  Diabetes screening. This is done by checking your blood sugar (glucose) after you have not eaten for a while (fasting).  Blood pressure.  Lipid and cholesterol levels. These may be checked every 5 years starting at age 20.  Skin check.  Hepatitis C blood test.  Hepatitis B blood test.  Sexually transmitted disease (STD) testing.  BRCA-related cancer screening. This may be done if you have a family history of breast, ovarian, tubal, or peritoneal cancers.  Pelvic exam and Pap test. This may be done every 3 years starting at age 21. Starting at age 30, this may be done every 5 years if you have a Pap test in combination with an HPV test. Discuss your test results, treatment options, and if necessary, the need for more tests with your health care provider. Vaccines  Your health care provider may recommend certain vaccines, such as:  Influenza vaccine. This is recommended every year.  Tetanus, diphtheria, and acellular pertussis (Tdap, Td) vaccine. You may need a Td booster every 10 years.  Varicella vaccine. You may need this if you have not been vaccinated.  HPV vaccine. If you are 26 or younger, you may need three doses over 6 months.  Measles, mumps, and rubella (MMR) vaccine. You may need at least one dose of MMR. You may also need a second dose.  Pneumococcal 13-valent conjugate (PCV13) vaccine. You may need this if you have certain   conditions and were not previously vaccinated.  Pneumococcal polysaccharide (PPSV23) vaccine. You may need one or two doses if you smoke cigarettes or if you have certain conditions.  Meningococcal vaccine. One dose is recommended if you are age 19-21 years and a first-year college student living in a residence hall, or if you have one of several  medical conditions. You may also need additional booster doses.  Hepatitis A vaccine. You may need this if you have certain conditions or if you travel or work in places where you may be exposed to hepatitis A.  Hepatitis B vaccine. You may need this if you have certain conditions or if you travel or work in places where you may be exposed to hepatitis B.  Haemophilus influenzae type b (Hib) vaccine. You may need this if you have certain risk factors. Talk to your health care provider about which screenings and vaccines you need and how often you need them. This information is not intended to replace advice given to you by your health care provider. Make sure you discuss any questions you have with your health care provider. Document Released: 03/02/2001 Document Revised: 09/24/2015 Document Reviewed: 11/05/2014 Elsevier Interactive Patient Education  2017 Elsevier Inc.  

## 2016-05-23 LAB — COMPREHENSIVE METABOLIC PANEL
ALT: 47 IU/L — ABNORMAL HIGH (ref 0–32)
AST: 28 IU/L (ref 0–40)
Albumin/Globulin Ratio: 1.3 (ref 1.2–2.2)
Albumin: 4 g/dL (ref 3.5–5.5)
Alkaline Phosphatase: 83 IU/L (ref 39–117)
BILIRUBIN TOTAL: 0.3 mg/dL (ref 0.0–1.2)
BUN/Creatinine Ratio: 7 — ABNORMAL LOW (ref 9–23)
BUN: 5 mg/dL — ABNORMAL LOW (ref 6–20)
CHLORIDE: 102 mmol/L (ref 96–106)
CO2: 22 mmol/L (ref 18–29)
Calcium: 8.9 mg/dL (ref 8.7–10.2)
Creatinine, Ser: 0.68 mg/dL (ref 0.57–1.00)
GFR calc non Af Amer: 111 mL/min/{1.73_m2} (ref >59)
GFR, EST AFRICAN AMERICAN: 127 mL/min/{1.73_m2} (ref >59)
GLOBULIN, TOTAL: 3 g/dL (ref 1.5–4.5)
Glucose: 97 mg/dL (ref 65–99)
POTASSIUM: 3.9 mmol/L (ref 3.5–5.2)
SODIUM: 140 mmol/L (ref 134–144)
Total Protein: 7 g/dL (ref 6.0–8.5)

## 2016-05-23 LAB — HEMOGLOBIN A1C
Est. average glucose Bld gHb Est-mCnc: 103 mg/dL
Hgb A1c MFr Bld: 5.2 % (ref 4.8–5.6)

## 2016-05-23 LAB — LIPID PANEL
CHOLESTEROL TOTAL: 161 mg/dL (ref 100–199)
Chol/HDL Ratio: 3.9 ratio (ref 0.0–4.4)
HDL: 41 mg/dL (ref >39)
LDL CALC: 105 mg/dL — AB (ref 0–99)
Triglycerides: 76 mg/dL (ref 0–149)
VLDL Cholesterol Cal: 15 mg/dL (ref 5–40)

## 2016-05-23 LAB — T4, FREE: FREE T4: 0.96 ng/dL (ref 0.82–1.77)

## 2016-05-23 LAB — TSH: TSH: 6.6 u[IU]/mL — ABNORMAL HIGH (ref 0.450–4.500)

## 2016-09-16 ENCOUNTER — Telehealth: Payer: Self-pay | Admitting: Family Medicine

## 2016-09-16 NOTE — Telephone Encounter (Signed)
Pt wants results from her labs that was done on 05/22/16, labs has NOT been release by the provider as of yet.  Please adv

## 2016-09-17 NOTE — Telephone Encounter (Signed)
Please post lab results

## 2016-09-20 ENCOUNTER — Encounter: Payer: Self-pay | Admitting: Family Medicine

## 2016-09-20 NOTE — Telephone Encounter (Signed)
Call ---  -Sugar/glucose is normal.  No evidence of diabetes. -Kidney function is normal. -ALT is slightly elevated; I would recommend repeating again in 3-6 months. I would avoid alcohol and Tylenol containing products. -Cholesterol is normal.  -Thyroid remains slightly elevated with a TSH of 6.6.  Current guidelines do not recommend treatment with medication until TSH is greater than 10.0.  Due to thyroid enlargement detected at physical, I recommend undergoing thyroid ultrasound.  Has patient had the ultrasound completed; I do not see the results in chart. -urine is normal.

## 2016-09-23 NOTE — Telephone Encounter (Signed)
[facility] and is in their work que. The pt can call them at [phone] if she would like to schedule before they call her. Thanks!

## 2016-09-23 NOTE — Telephone Encounter (Signed)
Spoke with patient and advised her of lab results. Patient verbalized understanding, states that she was never contacted for US scheduling. Message routed to referral desk at this time./ S.Arihaan Bellucci,CMA

## 2016-11-05 IMAGING — CR DG LUMBAR SPINE COMPLETE 4+V
5 series · 5 of 5 positions shown · non-contrast
Comparison: None.

CLINICAL DATA: 40-year-old female with a history of lumbar back
pain.

EXAM:
LUMBAR SPINE - COMPLETE 4+ VIEW

[AP]
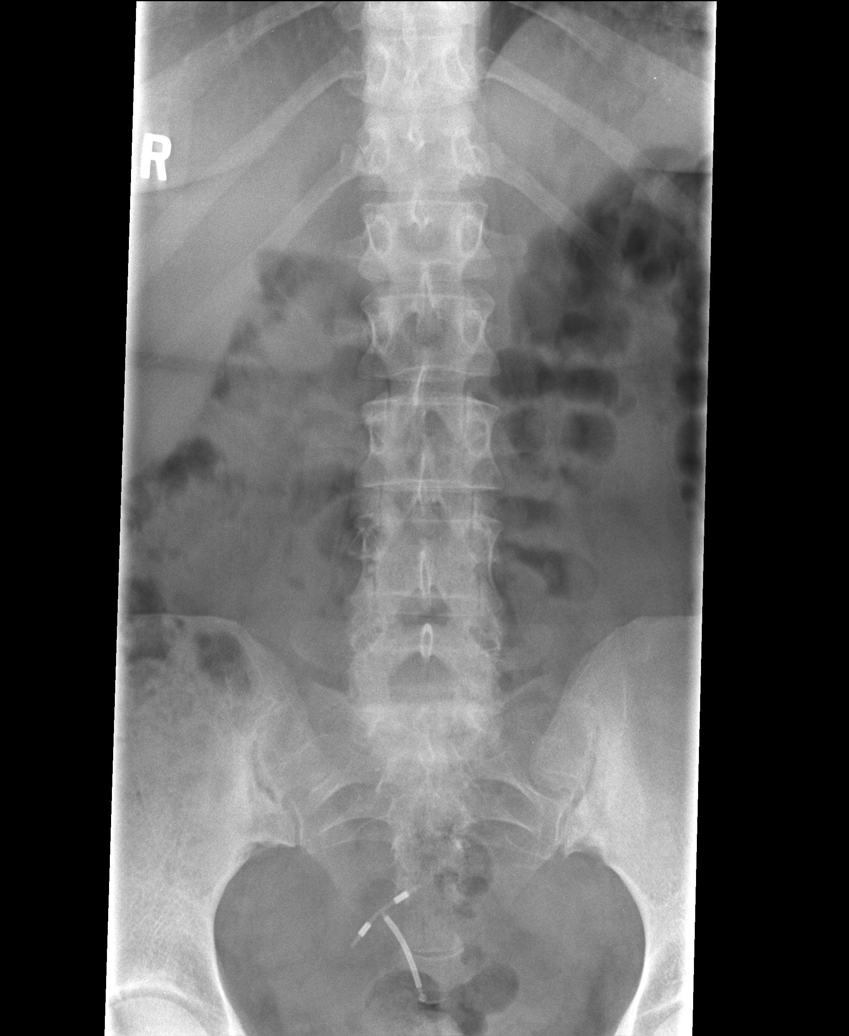

[rpo]
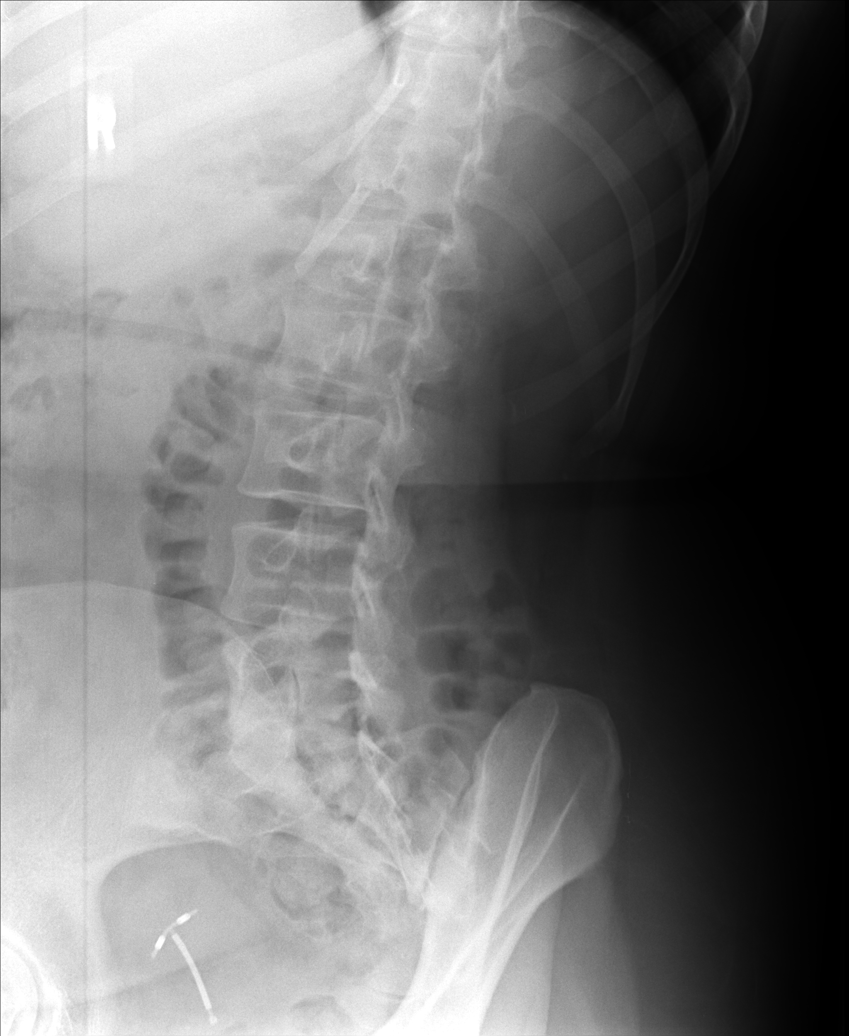

[lpo]
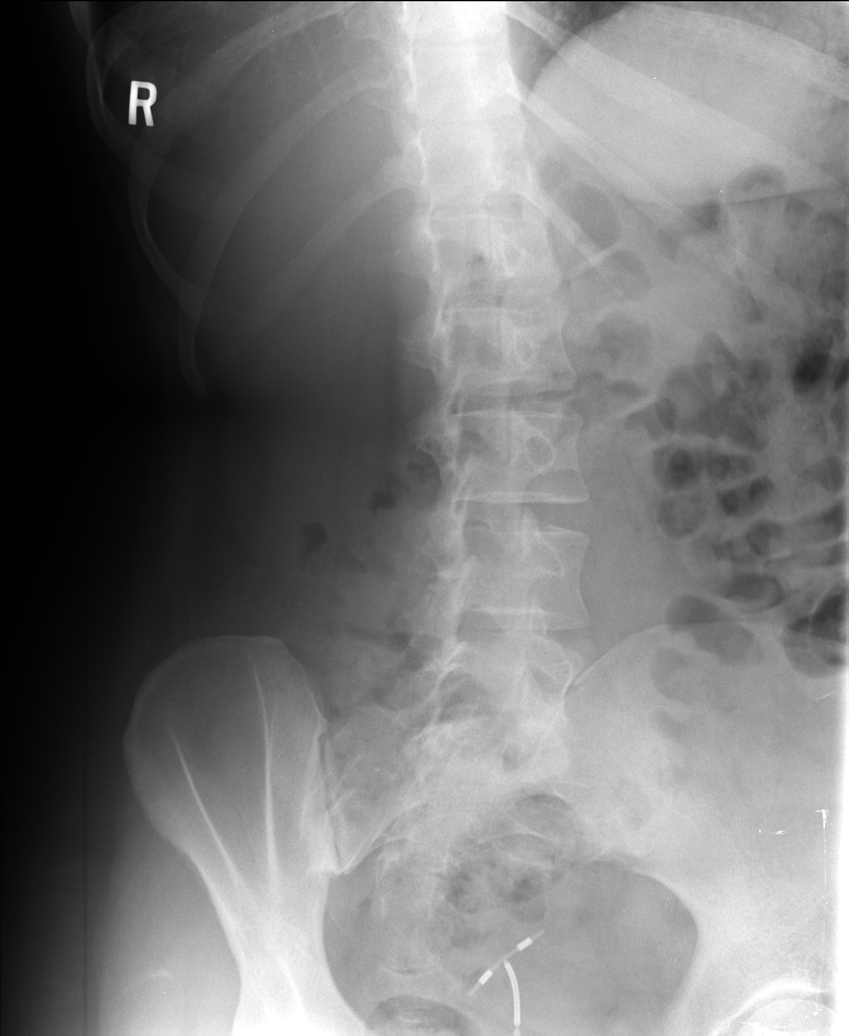

[lateral]
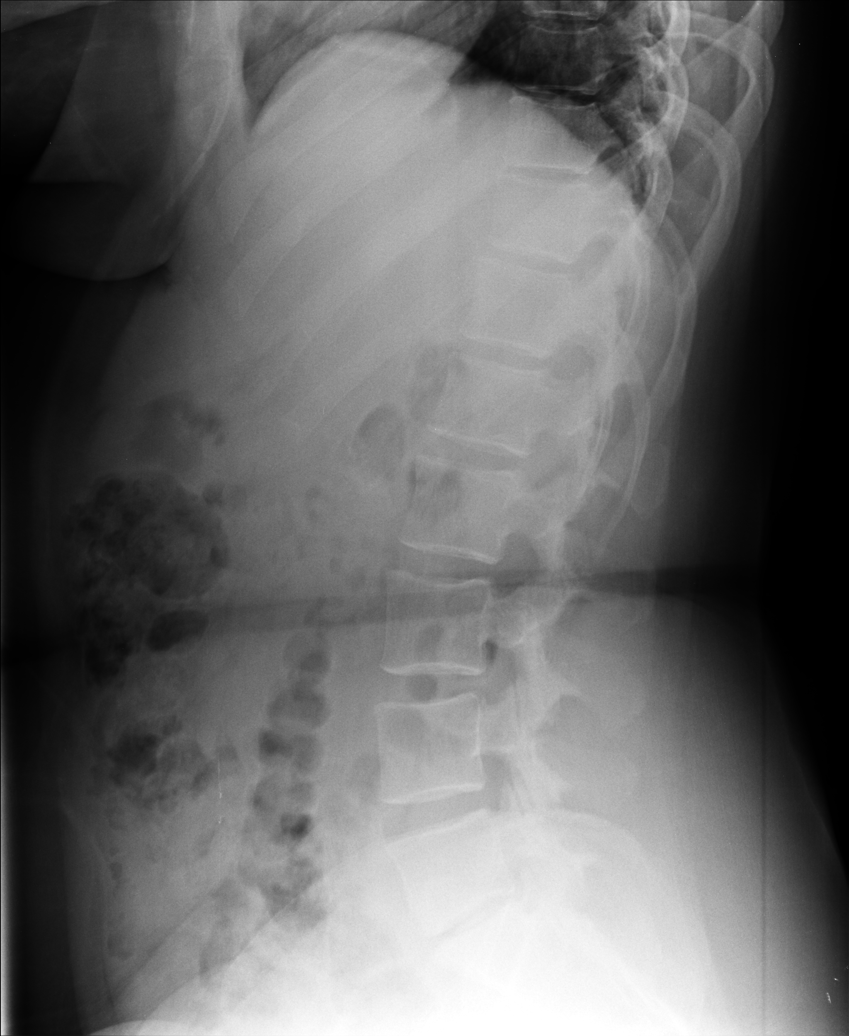

[l5 s1]
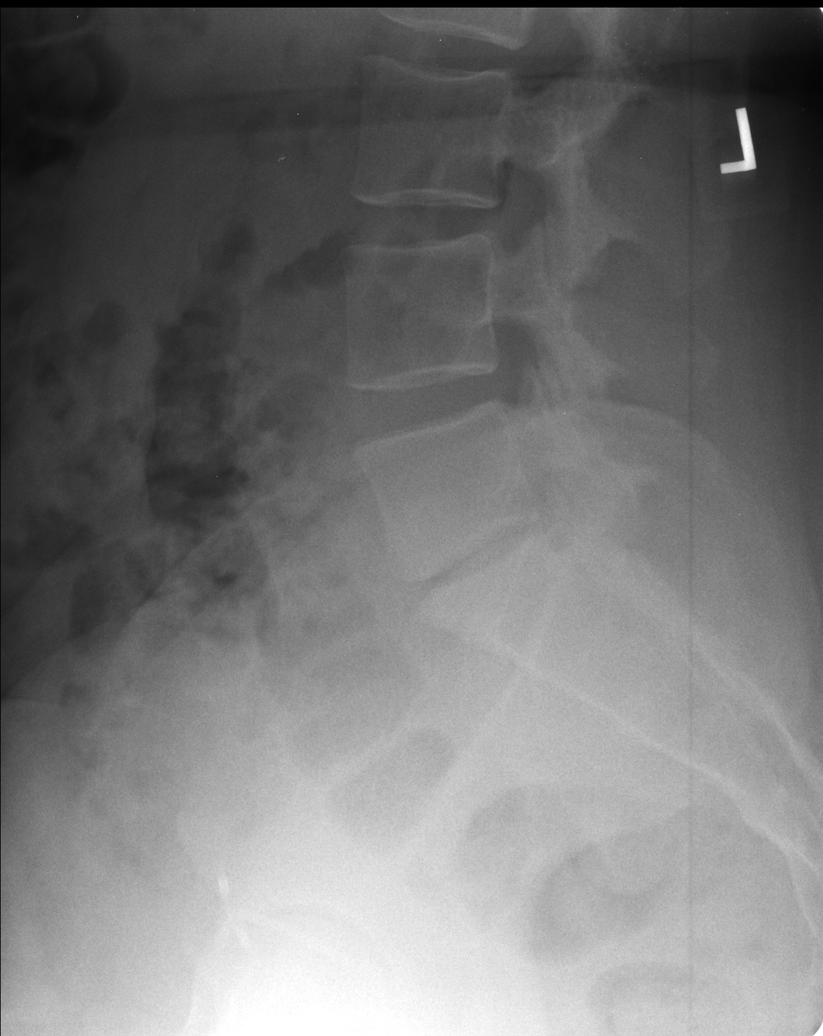

[5 of 5 positions shown; findings below may reference images not displayed]

FINDINGS: Lumbar Spine:

Lumbar vertebral elements maintain normal alignment without evidence
of anterolisthesis, retrolisthesis, subluxation.

No fracture line identified.  Vertebral body heights maintained.

Disc space narrowing at L5-S1.

Unremarkable appearance of the visualized abdomen.

Oblique images demonstrate no displaced pars defect.

IUD projects over the anatomic pelvis.
IMPRESSION: No radiographic evidence of acute fracture or malalignment of the
lumbar spine.

Early degenerative disc changes at L5-S1.

## 2017-06-09 ENCOUNTER — Encounter: Payer: Self-pay | Admitting: Family Medicine

## 2019-02-26 ENCOUNTER — Ambulatory Visit: Payer: BC Managed Care – PPO | Attending: Internal Medicine

## 2019-02-26 DIAGNOSIS — Z20822 Contact with and (suspected) exposure to covid-19: Secondary | ICD-10-CM

## 2019-02-27 LAB — NOVEL CORONAVIRUS, NAA: SARS-CoV-2, NAA: NOT DETECTED

## 2021-07-17 ENCOUNTER — Other Ambulatory Visit: Payer: Self-pay | Admitting: Obstetrics and Gynecology

## 2021-11-20 ENCOUNTER — Encounter: Payer: Self-pay | Admitting: Obstetrics and Gynecology

## 2021-11-20 ENCOUNTER — Other Ambulatory Visit: Payer: Self-pay | Admitting: Obstetrics and Gynecology

## 2021-11-20 DIAGNOSIS — R928 Other abnormal and inconclusive findings on diagnostic imaging of breast: Secondary | ICD-10-CM

## 2021-12-23 ENCOUNTER — Other Ambulatory Visit: Payer: Self-pay | Admitting: Obstetrics and Gynecology

## 2021-12-23 DIAGNOSIS — R928 Other abnormal and inconclusive findings on diagnostic imaging of breast: Secondary | ICD-10-CM

## 2021-12-28 ENCOUNTER — Other Ambulatory Visit: Payer: BC Managed Care – PPO

## 2022-01-13 ENCOUNTER — Ambulatory Visit: Admission: RE | Admit: 2022-01-13 | Payer: BC Managed Care – PPO | Source: Ambulatory Visit

## 2022-01-13 ENCOUNTER — Ambulatory Visit
Admission: RE | Admit: 2022-01-13 | Discharge: 2022-01-13 | Disposition: A | Discharge status: Home / Self Care | Payer: BC Managed Care – PPO | Source: Ambulatory Visit | Attending: Obstetrics and Gynecology | Admitting: Obstetrics and Gynecology

## 2022-01-13 DIAGNOSIS — R928 Other abnormal and inconclusive findings on diagnostic imaging of breast: Secondary | ICD-10-CM

## 2022-01-28 ENCOUNTER — Ambulatory Visit: Payer: BC Managed Care – PPO | Admitting: Internal Medicine

## 2022-02-11 ENCOUNTER — Ambulatory Visit: Payer: BC Managed Care – PPO | Attending: Internal Medicine | Admitting: Internal Medicine

## 2022-02-11 ENCOUNTER — Encounter: Payer: Self-pay | Admitting: Internal Medicine

## 2022-02-11 VITALS — BP 120/82 | HR 50 | Ht 67.0 in | Wt 208.6 lb

## 2022-02-11 DIAGNOSIS — R072 Precordial pain: Secondary | ICD-10-CM

## 2022-02-11 DIAGNOSIS — Z8249 Family history of ischemic heart disease and other diseases of the circulatory system: Secondary | ICD-10-CM | POA: Diagnosis not present

## 2022-02-11 NOTE — Progress Notes (Signed)
Cardiology Office Note:    Date:  02/11/2022   ID:  Loretta Adams, DOB [birth year], MRN ID:[id number]  PCP:  No primary care provider on file.  Cardiologist:  Elouise Munroe, MD  Electrophysiologist:  None   Referring MD: Everardo Beals, NP   Chief Complaint/Reason for Referral: Chest pain  History of Present Illness:    Loretta Adams is a 47 y.o. female with a history of hypothyroidism, here for the evaluation of chest pain.  Referral notes from Everardo Beals, NP personally reviewed. At their visit 11/2021 she had reported intermittent left sided burning chest pain with radiation around to her back. Onset about 2 months prior. She also complained of associated hard, fast palpitations. She was concerned due to numerous family members with heart issues. Her EKG at that visit showed SR with no acute processes.  Today, she confirms struggling with intermittent chest pain with tenderness on palpation. Typically her chest pain will resolve spontaneously after about 5 minutes. This last occurred 2 days ago. She was sitting on the couch, and her chest pain occurred as she was standing up. This was described as a burning pain. She denies eating spicy foods. During physical examination today her chest pain was not reproducible.  Sometimes when she is nervous she also experiences dyspnea and chest discomfort. She is forced to take slower deep breaths, or bend forwards to breathe.  Additionally she complains of intermittent back pain. This may be persistent throughout the day, but does improve with taking ibuprofen. She believes her back pain may be related to her work, as she sits and uses a computer frequently.  Referral for palpitations as well, but patient does not endorse these today.   Recently she had a mammogram which was reportedly normal.  In her family, she states that her grandparents on both sides all had diabetes, hyperlipidemia, and heart disease. Her 2 uncles needed heart  catheterizations with stent placements (youngest developed heart issues in his 78's). Her mother recently developed hypertension at 45 yo.   The patient denies PND, orthopnea, or leg swelling. Denies cough, fever, chills. Denies nausea, vomiting. Denies syncope or presyncope. Denies dizziness or lightheadedness. Denies snoring.   No past medical history on file.  Past Surgical History:  Procedure Laterality Date   MULTIPLE TOOTH EXTRACTIONS      Current Medications: Current Meds  Medication Sig   levothyroxine (SYNTHROID) 50 MCG tablet Take 1 tablet (50 mcg total) by mouth daily before breakfast.     Allergies:   Amoxicillin   Social History   Tobacco Use   Smoking status: Never   Smokeless tobacco: Never  Substance Use Topics   Alcohol use: No   Drug use: No     Family History: The patient's family history includes Asthma in her maternal grandmother; Diabetes in her maternal grandmother; Heart disease in her maternal grandmother; Hyperlipidemia in her maternal grandmother; Hypertension in her maternal grandmother.  ROS:   Please see the history of present illness.    (+) Chest pain (+) Back pain (+) Shortness of breath All other systems reviewed and are negative.  EKGs/Labs/Other Studies Reviewed:    The following studies were reviewed today:  No prior cardiovascular studies available.  EKG:  EKG is personally reviewed. 02/11/2022: Sinus bradycardia. Anterior infarct pattern.   Imaging studies that I have independently reviewed today: n/a  Recent Labs: No results found for requested labs within last 365 days.   Recent Lipid Panel    Component Value Date/Time  CHOL 161 05/22/2016 1550   TRIG 76 05/22/2016 1550   HDL 41 05/22/2016 1550   CHOLHDL 3.9 05/22/2016 1550   LDLCALC 105 (H) 05/22/2016 1550    Physical Exam:    VS:  BP 120/82   Pulse (!) 50   Ht '5\' 7"'$  (1.702 m)   Wt 208 lb 9.6 oz (94.6 kg)   SpO2 98%   BMI 32.67 kg/m     Wt Readings  from Last 5 Encounters:  02/11/22 208 lb 9.6 oz (94.6 kg)  05/22/16 207 lb (93.9 kg)  01/08/16 217 lb (98.4 kg)  04/12/15 206 lb 6.4 oz (93.6 kg)  07/19/14 228 lb (103.4 kg)    Constitutional: No acute distress Eyes: sclera non-icteric, normal conjunctiva and lids ENMT: normal dentition, moist mucous membranes Cardiovascular: regular rhythm, normal rate, no murmur. S1 and S2 normal. No jugular venous distention.  Respiratory: clear to auscultation bilaterally GI : normal bowel sounds, soft and nontender. No distention.   MSK: extremities warm, well perfused. No edema.  NEURO: grossly nonfocal exam, moves all extremities. PSYCH: alert and oriented x 3, normal mood and affect.   ASSESSMENT:    1. Precordial pain   2. Family history of premature CAD    PLAN:    Precordial pain - Plan: EKG [id number], Basic metabolic panel, CT CORONARY MORPH W/CTA COR W/SCORE W/CA W/CM &/OR WO/CM, Lipid panel, Lipoprotein A (LPA)  Family history of premature CAD  - plan for coronary CTA after shared decision making.  - further risk stratification after testing. No recent lipids to review, obtain today with pre CT labs.  -review risk reduction strategies including exercise if no obstructive CAD on CCTA, and diet changes. Also discussed management of MSK pain if contributing.  Exercise recommendations: Goal of exercising for at least 30 minutes a day, at least 5 times per week.  Please exercise to a moderate exertion.  This means that while exercising it is difficult to speak in full sentences, however you are not so short of breath that you feel you must stop, and not so comfortable that you can carry on a full conversation.  Exertion level should be approximately a 5/10, if 10 is the most exertion you can perform.  Diet recommendations: Recommend a heart healthy diet such as the Mediterranean diet.  This diet consists of plant based foods, healthy fats, lean meats, olive oil.  It suggests limiting the  intake of simple carbohydrates such as white breads, pastries, and pastas.  It also limits the amount of red meat, wine, and dairy products such as cheese that one should consume on a daily basis.  Cherlynn Kaiser, MD, [place]   Shared Decision Making/Informed Consent:       Medication Adjustments/Labs and Tests Ordered: Current medicines are reviewed at length with the patient today.  Concerns regarding medicines are outlined above.   Orders Placed This Encounter  Procedures   CT CORONARY MORPH W/CTA COR W/SCORE W/CA W/CM &/OR WO/CM   Basic metabolic panel   Lipid panel   Lipoprotein A (LPA)   EKG 12-Lead   No orders of the defined types were placed in this encounter.  Patient Instructions  Medication Instructions:  No Changes In Medications at this time.  *If you need a refill on your cardiac medications before your next appointment, please call your pharmacy*  Lab Work: BLOOD WORK TODAY  If you have labs (blood work) drawn today and your tests are completely normal, you  will receive your results only by: [place] (if you have MyChart) OR A paper copy in the mail If you have any lab test that is abnormal or we need to change your treatment, we will call you to review the results.  Testing/Procedures: Your physician has requested that you have cardiac CT. Cardiac computed tomography (CT) is a painless test that uses an x-ray machine to take clear, detailed pictures of your heart. For further information please visit [facility]uge[facility]iesta.tn. Please follow instruction sheet as given.  Follow-Up: At [facility], you and your health needs are our priority.  As part of our continuing mission to provide you with exceptional heart care, we have created designated Provider Care Teams.  These Care Teams include your primary Cardiologist (physician) and Advanced Practice Providers (APPs -  Physician Assistants and Nurse Practitioners) who all work  together to provide you with the care you need, when you need it.  Your next appointment:   3 week(s)  Provider:   ANY APP  Other Instructions   Your cardiac CT will be scheduled at one of the below locations:   [facility] [address] [place], [place] [address] [phone]  If scheduled at [facility], please arrive at the [facility] and Children's Entrance (Entrance C2) of [facility] 30 minutes prior to test start time. You can use the FREE valet parking offered at entrance C (encouraged to control the heart rate for the test)  Proceed to the [facility] Radiology Department (first floor) to check-in and test prep.  All radiology patients and guests should use entrance C2 at [facility], accessed from [facility], even though the hospital's physical address listed is [address]    If scheduled at [facility] or [facility], please arrive 15 mins early for check-in and test prep.    Hold all erectile dysfunction medications at least 3 days (72 hrs) prior to test. (Ie viagra, cialis, sildenafil, tadalafil, etc) We will administer nitroglycerin during this exam.   On the Night Before the Test: Be sure to Drink plenty of water. Do not consume any caffeinated/decaffeinated beverages or chocolate 12 hours prior to your test. Do not take any antihistamines 12 hours prior to your test.  On the Day of the Test: Drink plenty of water until 1 hour prior to the test. Do not eat any food 1 hour prior to test. You may take your regular medications prior to the test.  FEMALES- please wear underwire-free bra if available, avoid dresses & tight clothing  After the Test: Drink plenty of water. After receiving IV contrast, you may experience a mild flushed feeling. This is normal. On occasion, you may experience a mild rash up to 24 hours after the test. This is not dangerous.  If this occurs, you can take Benadryl 25 mg and increase your fluid intake. If you experience trouble breathing, this can be serious. If it is severe call 911 IMMEDIATELY. If it is mild, please call our office. If you take any of these medications: Glipizide/Metformin, Avandament, Glucavance, please do not take 48 hours after completing test unless otherwise instructed.  We will call to schedule your test 2-4 weeks out understanding that some insurance companies will need an authorization prior to the service being performed.   For non-scheduling related questions, please contact the cardiac imaging nurse navigator should you have any questions/concerns: Marchia Bond, Cardiac Imaging Nurse Navigator Gordy Clement, Cardiac  Imaging Nurse [place] [facility] Direct Office Dial: [phone]   For scheduling needs, including cancellations and rescheduling, please call [place], [phone].    I,Mathew Stumpf,acting as a Education administrator for Elouise Munroe, MD.,have documented all relevant documentation on the behalf of Elouise Munroe, MD,as directed by  Elouise Munroe, MD while in the presence of Elouise Munroe, MD.  I, Elouise Munroe, MD, have reviewed all documentation for the visit on 02/11/2022. The documentation on today's date of service for the exam, diagnosis, procedures, and orders are all accurate and complete.

## 2022-02-11 NOTE — Patient Instructions (Signed)
Medication Instructions:  No Changes In Medications at this time.  *If you need a refill on your cardiac medications before your next appointment, please call your pharmacy*  Lab Work: BLOOD WORK TODAY  If you have labs (blood work) drawn today and your tests are completely normal, you will receive your results only by: [place] (if you have MyChart) OR A paper copy in the mail If you have any lab test that is abnormal or we need to change your treatment, we will call you to review the results.  Testing/Procedures: Your physician has requested that you have cardiac CT. Cardiac computed tomography (CT) is a painless test that uses an x-ray machine to take clear, detailed pictures of your heart. For further information please visit [facility]uge[facility]iest[facility]tn. Please follow instruction sheet as given.  Follow-Up: At [facility], you and your health needs are our priority.  As part of our continuing mission to provide you with exceptional heart care, we have created designated Provider Care Teams.  These Care Teams include your primary Cardiologist (physician) and Advanced Practice Providers (APPs -  Physician Assistants and Nurse Practitioners) who all work together to provide you with the care you need, when you need it.  Your next appointment:   3 week(s)  Provider:   ANY APP  Other Instructions   Your cardiac CT will be scheduled at one of the below locations:   [facility] [address] [place], [place] [address] [phone]  If scheduled at [facility], please arrive at the [facility] and Children's Entrance (Entrance C2) of [facility] 30 minutes prior to test start time. You can use the FREE valet parking offered at entrance C (encouraged to control the heart rate for the test)  Proceed to the [facility] Radiology Department (first floor) to check-in and test prep.  All radiology patients and guests should use entrance C2 at [facility], accessed from [facility], even though the hospital's physical address listed is [address]    If scheduled at [facility] or [facility], please arrive 15 mins early for check-in and test prep.    Hold all erectile dysfunction medications at least 3 days (72 hrs) prior to test. (Ie viagra, cialis, sildenafil, tadalafil, etc) We will administer nitroglycerin during this exam.   On the Night Before the Test: Be sure to Drink plenty of water. Do not consume any caffeinated/decaffeinated beverages or chocolate 12 hours prior to your test. Do not take any antihistamines 12 hours prior to your test.  On the Day of the Test: Drink plenty of water until 1 hour prior to the test. Do not eat any food 1 hour prior to test. You may take your regular medications prior to the test.  FEMALES- please wear underwire-free bra if available, avoid dresses & tight clothing  After the Test: Drink plenty of water. After receiving IV contrast, you may experience a mild flushed feeling. This is normal. On occasion, you may experience a mild rash up to 24 hours after the test. This is not dangerous. If this occurs, you can take Benadryl 25 mg and increase your fluid intake. If you experience trouble breathing, this can be serious. If it is severe call 911 IMMEDIATELY. If it is mild, please call our office. If you take any of these medications: Glipizide/Metformin, Avandament, Glucavance, please do not take 48 hours after completing test unless otherwise instructed.  We will call  to schedule your test 2-4 weeks out understanding that some insurance companies will need an authorization prior to the service being performed.   For non-scheduling related questions, please contact the cardiac imaging nurse navigator should you have any questions/concerns: Marchia Bond, Cardiac Imaging Nurse Navigator Gordy Clement, Cardiac Imaging  Nurse Navigator [place] [facility] Direct Office Dial: [phone]   For scheduling needs, including cancellations and rescheduling, please call [place], [phone].

## 2022-02-12 LAB — BASIC METABOLIC PANEL
BUN/Creatinine Ratio: 15 (ref 9–23)
BUN: 9 mg/dL (ref 6–24)
CO2: 22 mmol/L (ref 20–29)
Calcium: 8.9 mg/dL (ref 8.7–10.2)
Chloride: 105 mmol/L (ref 96–106)
Creatinine, Ser: 0.61 mg/dL (ref 0.57–1.00)
Glucose: 80 mg/dL (ref 70–99)
Potassium: 4.4 mmol/L (ref 3.5–5.2)
Sodium: 140 mmol/L (ref 134–144)
eGFR: 112 mL/min/{1.73_m2} (ref >59)

## 2022-02-12 LAB — LIPID PANEL
Chol/HDL Ratio: 3.1 ratio (ref 0.0–4.4)
Cholesterol, Total: 154 mg/dL (ref 100–199)
HDL: 50 mg/dL (ref >39)
LDL Chol Calc (NIH): 91 mg/dL (ref 0–99)
Triglycerides: 68 mg/dL (ref 0–149)
VLDL Cholesterol Cal: 13 mg/dL (ref 5–40)

## 2022-02-12 LAB — LIPOPROTEIN A (LPA): Lipoprotein (a): 35 nmol/L (ref <75.0)

## 2022-02-18 ENCOUNTER — Ambulatory Visit (HOSPITAL_COMMUNITY): Payer: BC Managed Care – PPO

## 2022-03-07 NOTE — Progress Notes (Deleted)
Cardiology Office Note:    Date:  03/07/2022   ID:  Loretta Adams, DOB 1975-05-16, MRN [id number]  PCP:  No primary care provider on file.   [facility] Cardiologist:  Elouise Munroe, MD {   Referring MD: No ref. provider found   No chief complaint on file. ***  History of Present Illness:    Loretta Adams is a 47 y.o. female without past medical history. Patient was seen by Dr. Margaretann Loveless on 02/11/22 during which time he complained of intermittent left sided burning chest pain with radiation to her back, palpitations. Symptoms had started about 2 months prior to her appointment. Patient was sent for coronary CT, but she canceled the study.   Today, patient reports   Precordial Chest Pain  - Patient was seen by Dr. Margaretann Loveless on 1/25 complaining of intermittent left sided chest pain. She was sent for coronary CT that was scheduled for 2/1, but patient canceled the study - Now, patient reports   No past medical history on file.  Past Surgical History:  Procedure Laterality Date   MULTIPLE TOOTH EXTRACTIONS      Current Medications: No outpatient medications have been marked as taking for the 03/08/22 encounter (Appointment) with Deberah Pelton, NP.     Allergies:   Amoxicillin   Social History   Socioeconomic History   Marital status: Married    Spouse name: Not on file   Number of children: Not on file   Years of education: Not on file   Highest education level: Not on file  Occupational History   Not on file  Tobacco Use   Smoking status: Never   Smokeless tobacco: Never  Substance and Sexual Activity   Alcohol use: No   Drug use: No   Sexual activity: Yes    Partners: Male    Birth control/protection: I.U.D.  Other Topics Concern   Not on file  Social History Narrative   Marital status: married x 2002; from [place]      Children: 4 children (11, 9, 7, 2)      Lives: with husband, 4 children      Employment: pre-K Pharmacist, hospital       Tobacco: none      Alcohol: none      Drugs: none      Exercise:    Social Determinants of Radio broadcast assistant Strain: Not on file  Food Insecurity: Not on file  Transportation Needs: Not on file  Physical Activity: Not on file  Stress: Not on file  Social Connections: Not on file     Family History: The patient's ***family history includes Asthma in her maternal grandmother; Diabetes in her maternal grandmother; Heart disease in her maternal grandmother; Hyperlipidemia in her maternal grandmother; Hypertension in her maternal grandmother.  ROS:   Please see the history of present illness.    *** All other systems reviewed and are negative.  EKGs/Labs/Other Studies Reviewed:    The following studies were reviewed today: ***  EKG:  EKG is *** ordered today.  The ekg ordered today demonstrates ***  Recent Labs: 02/11/2022: BUN 9; Creatinine, Ser 0.61; Potassium 4.4; Sodium 140  Recent Lipid Panel    Component Value Date/Time   CHOL 154 02/11/2022 1557   TRIG 68 02/11/2022 1557   HDL 50 02/11/2022 1557   CHOLHDL 3.1 02/11/2022 1557   LDLCALC 91 02/11/2022 1557     Risk Assessment/Calculations:   {Does this patient have ATRIAL FIBRILLATION?[phone]}  No BP recorded.  {Refresh Note OR Click here to enter BP  :1}***         Physical Exam:    VS:  There were no vitals taken for this visit.    Wt Readings from Last 3 Encounters:  02/11/22 208 lb 9.6 oz (94.6 kg)  05/22/16 207 lb (93.9 kg)  01/08/16 217 lb (98.4 kg)     GEN: *** Well nourished, well developed in no acute distress HEENT: Normal NECK: No JVD; No carotid bruits LYMPHATICS: No lymphadenopathy CARDIAC: ***RRR, no murmurs, rubs, gallops RESPIRATORY:  Clear to auscultation without rales, wheezing or rhonchi  ABDOMEN: Soft, non-tender, non-distended MUSCULOSKELETAL:  No edema; No deformity  SKIN: Warm and dry NEUROLOGIC:  Alert and oriented x 3 PSYCHIATRIC:  Normal affect   ASSESSMENT:     No diagnosis found. PLAN:    In order of problems listed above:  ***      {Are you ordering a CV Procedure (e.g. stress test, cath, DCCV, TEE, etc)?   Press F2        :[id number]    Medication Adjustments/Labs and Tests Ordered: Current medicines are reviewed at length with the patient today.  Concerns regarding medicines are outlined above.  No orders of the defined types were placed in this encounter.  No orders of the defined types were placed in this encounter.   There are no Patient Instructions on file for this visit.   Signed, Margie Billet, PA-C  03/07/2022 4:30 PM    [facility]

## 2022-03-08 ENCOUNTER — Ambulatory Visit: Payer: BC Managed Care – PPO | Admitting: General Practice

## 2022-03-08 ENCOUNTER — Telehealth: Payer: Self-pay | Admitting: Internal Medicine

## 2022-03-08 NOTE — Telephone Encounter (Signed)
Spoke with pt regarding lab work. Preliminary report shows that labs are all within normal limits. Pt verbalizes understanding.

## 2022-03-08 NOTE — Telephone Encounter (Signed)
Pt called about lab results. °

## 2022-03-16 ENCOUNTER — Other Ambulatory Visit: Payer: Self-pay | Admitting: Family Medicine

## 2022-03-16 ENCOUNTER — Ambulatory Visit: Payer: Self-pay

## 2022-03-16 DIAGNOSIS — M25561 Pain in right knee: Secondary | ICD-10-CM

## 2022-03-16 DIAGNOSIS — M25572 Pain in left ankle and joints of left foot: Secondary | ICD-10-CM

## 2022-04-11 NOTE — Progress Notes (Deleted)
   Cardiology Clinic Note   Date: 04/11/2022 ID: Loretta Adams Feb 23, 1975, MRN [id number]  Primary Cardiologist:  Elouise Munroe, MD  Patient Profile    Loretta Adams is a 47 y.o. female who presents to the clinic today for follow-up.  Past medical history significant for: Chest pain. Palpitations. Hypothyroidism.   History of Present Illness    Loretta Adams was first evaluated by Dr. Margaretann Loveless on 02/11/2022 for chest pain at the request of Loretta Beals, NP.  At visit patient endorses burning chest pain.  She reports a strong family history of CAD.  Coronary CTA was ordered but does not appear to have been completed.  Today, patient ***  Precordial pain.  Patient ***   ROS: All other systems reviewed and are otherwise negative except as noted in History of Present Illness.  Studies Reviewed    ECG personally reviewed by me today: ***  No significant changes from ***  Risk Assessment/Calculations    {Does this patient have ATRIAL FIBRILLATION?:[phone]} No BP recorded.  {Refresh Note OR Click here to enter BP  :1}***        Physical Exam    VS:  LMP 02/16/2022 (Approximate)  , BMI There is no height or weight on file to calculate BMI.  GEN: Well nourished, well developed, in no acute distress. Neck: No JVD or carotid bruits. Cardiac: *** RRR. No murmurs. No rubs or gallops.   Respiratory:  Respirations regular and unlabored. Clear to auscultation without rales, wheezing or rhonchi. GI: Soft, nontender, nondistended. Extremities: Radials/DP/PT 2+ and equal bilaterally. No clubbing or cyanosis. No edema ***  Skin: Warm and dry, no rash. Neuro: Strength intact.  Assessment & Plan   ***  Disposition: ***     {Are you ordering a CV Procedure (e.g. stress test, cath, DCCV, TEE, etc)?   Press F2        :[id number]   Signed, Justice [place]. Nussen Pullin, DNP, NP-C

## 2022-04-14 ENCOUNTER — Ambulatory Visit: Payer: BC Managed Care – PPO | Admitting: Student

## 2023-01-18 ENCOUNTER — Other Ambulatory Visit: Payer: Self-pay | Admitting: Obstetrics and Gynecology

## 2023-01-18 DIAGNOSIS — Z1231 Encounter for screening mammogram for malignant neoplasm of breast: Secondary | ICD-10-CM

## 2023-02-01 ENCOUNTER — Ambulatory Visit
Admission: RE | Admit: 2023-02-01 | Discharge: 2023-02-01 | Disposition: A | Discharge status: Home / Self Care | Payer: 59 | Source: Ambulatory Visit | Attending: Obstetrics and Gynecology | Admitting: Obstetrics and Gynecology

## 2023-02-01 DIAGNOSIS — Z1231 Encounter for screening mammogram for malignant neoplasm of breast: Secondary | ICD-10-CM

## 2023-02-03 ENCOUNTER — Other Ambulatory Visit: Payer: Self-pay | Admitting: Obstetrics and Gynecology

## 2023-02-03 ENCOUNTER — Ambulatory Visit: Payer: Self-pay

## 2023-02-03 DIAGNOSIS — R928 Other abnormal and inconclusive findings on diagnostic imaging of breast: Secondary | ICD-10-CM

## 2023-02-12 ENCOUNTER — Other Ambulatory Visit: Payer: 59

## 2023-02-22 ENCOUNTER — Other Ambulatory Visit: Payer: 59
